# Patient Record
Sex: Female | Born: 1950 | Race: White | Hispanic: No | Marital: Married | Smoking: Never smoker
Health system: Southern US, Community
[De-identification: ages and names within clinical notes are randomized; demographics above are authoritative.]

## PROBLEM LIST (undated history)

## (undated) DIAGNOSIS — E119 Type 2 diabetes mellitus without complications: Secondary | ICD-10-CM

## (undated) DIAGNOSIS — E785 Hyperlipidemia, unspecified: Secondary | ICD-10-CM

## (undated) DIAGNOSIS — I10 Essential (primary) hypertension: Secondary | ICD-10-CM

## (undated) DIAGNOSIS — G5600 Carpal tunnel syndrome, unspecified upper limb: Secondary | ICD-10-CM

## (undated) DIAGNOSIS — M1990 Unspecified osteoarthritis, unspecified site: Secondary | ICD-10-CM

## (undated) DIAGNOSIS — R7989 Other specified abnormal findings of blood chemistry: Secondary | ICD-10-CM

## (undated) DIAGNOSIS — K219 Gastro-esophageal reflux disease without esophagitis: Secondary | ICD-10-CM

## (undated) DIAGNOSIS — C801 Malignant (primary) neoplasm, unspecified: Secondary | ICD-10-CM

## (undated) DIAGNOSIS — R945 Abnormal results of liver function studies: Secondary | ICD-10-CM

## (undated) HISTORY — PX: CATARACT EXTRACTION: SUR2

## (undated) HISTORY — PX: KNEE ARTHROSCOPY: SUR90

## (undated) HISTORY — DX: Hyperlipidemia, unspecified: E78.5

## (undated) HISTORY — DX: Essential (primary) hypertension: I10

## (undated) HISTORY — DX: Unspecified osteoarthritis, unspecified site: M19.90

## (undated) HISTORY — DX: Other specified abnormal findings of blood chemistry: R79.89

## (undated) HISTORY — DX: Carpal tunnel syndrome, unspecified upper limb: G56.00

## (undated) HISTORY — DX: Type 2 diabetes mellitus without complications: E11.9

## (undated) HISTORY — DX: Gastro-esophageal reflux disease without esophagitis: K21.9

## (undated) HISTORY — DX: Abnormal results of liver function studies: R94.5

## (undated) HISTORY — DX: Malignant (primary) neoplasm, unspecified: C80.1

---

## 2000-09-26 ENCOUNTER — Encounter: Payer: Self-pay | Admitting: Emergency Medicine

## 2000-09-26 ENCOUNTER — Inpatient Hospital Stay (HOSPITAL_COMMUNITY): Admission: EM | Admit: 2000-09-26 | Discharge: 2000-09-27 | Payer: Self-pay | Admitting: Emergency Medicine

## 2000-09-27 ENCOUNTER — Encounter: Payer: Self-pay | Admitting: *Deleted

## 2005-12-08 ENCOUNTER — Encounter: Admission: RE | Admit: 2005-12-08 | Discharge: 2005-12-08 | Payer: Self-pay | Admitting: Orthopaedic Surgery

## 2007-09-13 ENCOUNTER — Encounter: Admission: RE | Admit: 2007-09-13 | Discharge: 2007-09-13 | Payer: Self-pay | Admitting: Orthopaedic Surgery

## 2008-09-14 ENCOUNTER — Encounter: Payer: Self-pay | Admitting: Family Medicine

## 2008-09-14 LAB — CONVERTED CEMR LAB: Pap Smear: NORMAL

## 2008-10-06 ENCOUNTER — Emergency Department (HOSPITAL_COMMUNITY): Admission: EM | Admit: 2008-10-06 | Discharge: 2008-10-06 | Payer: Self-pay | Admitting: Emergency Medicine

## 2008-12-25 ENCOUNTER — Encounter: Payer: Self-pay | Admitting: Family Medicine

## 2009-03-09 ENCOUNTER — Encounter: Admission: RE | Admit: 2009-03-09 | Discharge: 2009-03-09 | Payer: Self-pay | Admitting: Family Medicine

## 2009-08-13 ENCOUNTER — Encounter: Payer: Self-pay | Admitting: Family Medicine

## 2009-08-13 LAB — CONVERTED CEMR LAB
Albumin: 4 g/dL
Calcium: 9.7 mg/dL
Cholesterol: 200 mg/dL
Direct LDL: 140 mg/dL
Glucose, Bld: 95 mg/dL
HDL: 47 mg/dL
Sodium: 141 meq/L
Total Bilirubin: 0.7 mg/dL
Total CHOL/HDL Ratio: 4.26

## 2009-11-13 HISTORY — PX: BREAST LUMPECTOMY: SHX2

## 2009-12-02 ENCOUNTER — Encounter: Payer: Self-pay | Admitting: Family Medicine

## 2009-12-09 ENCOUNTER — Encounter: Admission: RE | Admit: 2009-12-09 | Discharge: 2009-12-09 | Payer: Self-pay | Admitting: General Surgery

## 2009-12-13 ENCOUNTER — Encounter: Payer: Self-pay | Admitting: Family Medicine

## 2009-12-14 ENCOUNTER — Encounter: Admission: RE | Admit: 2009-12-14 | Discharge: 2009-12-14 | Payer: Self-pay | Admitting: General Surgery

## 2009-12-15 ENCOUNTER — Ambulatory Visit (HOSPITAL_BASED_OUTPATIENT_CLINIC_OR_DEPARTMENT_OTHER): Admission: RE | Admit: 2009-12-15 | Discharge: 2009-12-15 | Payer: Self-pay | Admitting: General Surgery

## 2009-12-22 ENCOUNTER — Ambulatory Visit: Payer: Self-pay | Admitting: Oncology

## 2010-01-05 ENCOUNTER — Ambulatory Visit: Admission: RE | Admit: 2010-01-05 | Discharge: 2010-03-09 | Payer: Self-pay | Admitting: Radiation Oncology

## 2010-01-05 ENCOUNTER — Encounter: Payer: Self-pay | Admitting: Family Medicine

## 2010-01-05 LAB — CBC WITH DIFFERENTIAL/PLATELET
BASO%: 0.4 % (ref 0.0–2.0)
HCT: 37.3 % (ref 34.8–46.6)
HGB: 12.6 g/dL (ref 11.6–15.9)
LYMPH%: 28.3 % (ref 14.0–49.7)
MCH: 29.9 pg (ref 25.1–34.0)
MCHC: 33.8 g/dL (ref 31.5–36.0)
MCV: 88.3 fL (ref 79.5–101.0)
MONO%: 6 % (ref 0.0–14.0)
NEUT%: 62.5 % (ref 38.4–76.8)
Platelets: 356 10*3/uL (ref 145–400)
RBC: 4.22 10*6/uL (ref 3.70–5.45)

## 2010-01-05 LAB — COMPREHENSIVE METABOLIC PANEL
ALT: 17 U/L (ref 0–35)
AST: 19 U/L (ref 0–37)
Albumin: 4.2 g/dL (ref 3.5–5.2)
Alkaline Phosphatase: 47 U/L (ref 39–117)
Calcium: 9.6 mg/dL (ref 8.4–10.5)
Sodium: 139 mEq/L (ref 135–145)
Total Bilirubin: 0.6 mg/dL (ref 0.3–1.2)

## 2010-01-27 ENCOUNTER — Encounter: Payer: Self-pay | Admitting: Family Medicine

## 2010-01-27 LAB — CONVERTED CEMR LAB
Cholesterol: 222 mg/dL
Triglycerides: 126 mg/dL

## 2010-03-09 ENCOUNTER — Encounter: Payer: Self-pay | Admitting: Family Medicine

## 2010-04-06 ENCOUNTER — Encounter: Payer: Self-pay | Admitting: Family Medicine

## 2010-04-12 ENCOUNTER — Encounter: Payer: Self-pay | Admitting: Family Medicine

## 2010-08-04 ENCOUNTER — Encounter: Payer: Self-pay | Admitting: Family Medicine

## 2010-08-19 ENCOUNTER — Ambulatory Visit: Payer: Self-pay | Admitting: Family Medicine

## 2010-08-19 DIAGNOSIS — I10 Essential (primary) hypertension: Secondary | ICD-10-CM | POA: Insufficient documentation

## 2010-08-23 LAB — CONVERTED CEMR LAB
Albumin: 4.1 g/dL (ref 3.5–5.2)
Alkaline Phosphatase: 56 units/L (ref 39–117)
BUN: 16 mg/dL (ref 6–23)
Calcium: 9.7 mg/dL (ref 8.4–10.5)
Cholesterol: 238 mg/dL — ABNORMAL HIGH (ref 0–200)
Sodium: 141 meq/L (ref 135–145)
Total Bilirubin: 1 mg/dL (ref 0.3–1.2)
Total CHOL/HDL Ratio: 5
Triglycerides: 118 mg/dL (ref 0.0–149.0)

## 2010-08-31 ENCOUNTER — Encounter: Payer: Self-pay | Admitting: Family Medicine

## 2010-08-31 DIAGNOSIS — E7800 Pure hypercholesterolemia, unspecified: Secondary | ICD-10-CM | POA: Insufficient documentation

## 2010-08-31 DIAGNOSIS — G5600 Carpal tunnel syndrome, unspecified upper limb: Secondary | ICD-10-CM | POA: Insufficient documentation

## 2010-11-30 ENCOUNTER — Encounter: Payer: Self-pay | Admitting: Family Medicine

## 2010-12-05 ENCOUNTER — Encounter (INDEPENDENT_AMBULATORY_CARE_PROVIDER_SITE_OTHER): Payer: Self-pay | Admitting: *Deleted

## 2010-12-14 ENCOUNTER — Encounter: Payer: Self-pay | Admitting: Family Medicine

## 2010-12-15 NOTE — Letter (Signed)
Summary: [facility]  [facility]   Imported By: Lanelle Bal 09/06/2010 08:57:58  _____________________________________________________________________  External Attachment:    Type:   Image     Comment:   External Document

## 2010-12-15 NOTE — Letter (Signed)
Summary: [facility]   Imported By: Lanelle Bal 08/17/2010 11:17:50  _____________________________________________________________________  External Attachment:    Type:   Image     Comment:   External Document  Appended Document: [facility]    Clinical Lists Changes  Observations: Added new observation of PAST MED HX: 2011 R breast CA, s/p lumpectomy and sentinel node biopsy and radiation- per Dr. Dwain Sarna (08/17/2010 23:03)       Past History:  Past Medical History: 2011 R breast CA, s/p lumpectomy and sentinel node biopsy and radiation- per Dr. Dwain Sarna

## 2010-12-15 NOTE — Letter (Signed)
Summary: [facility]  [facility]   Imported By: Lanelle Bal 09/06/2010 08:55:13  _____________________________________________________________________  External Attachment:    Type:   Image     Comment:   External Document

## 2010-12-15 NOTE — Op Note (Signed)
Summary: US Guided Biopsy & Breast US/[facility]  US Guided Biopsy & Breast US/[facility]   Imported By: Lanelle Bal 09/06/2010 08:59:10  _____________________________________________________________________  External Attachment:    Type:   Image     Comment:   External Document

## 2010-12-15 NOTE — Assessment & Plan Note (Signed)
Summary: XFER FROM [place] AND CPX/DLO   Vital Signs:  Patient profile:   60 year old female Height:      61 inches Weight:      195.25 pounds BMI:     37.03 Temp:     98.3 degrees F oral Pulse rate:   84 / minute Pulse rhythm:   regular BP sitting:   132 / 80  (left arm) Cuff size:   large  Vitals Entered By: Delilah Shan CMA Duncan Dull) 09-15-10 10:33 AM) CC: Transfer from [place] / CPX, Back Pain   History of Present Illness: Hypertension:      Using medication without problems or lightheadedness: yes Chest pain with exertion:no Edema:no Short of breath:no Average home BPs: normally lower than today Other issues: no  due for labs.   Allergies (verified): No Known Drug Allergies  Past History:  Family History: Last updated: Sep 15, 2010 F dead, CA-lung M alive, born 1930-04-21, crohn's disease  Social History: Last updated: 09/15/10 Married 1973 no tob no alcohol  [facility] the [facility] 3x[facility].   working full time reads for pleasure 2 children, both went to [employer].   Past Medical History: 2010-04-20 R breast CA, s/p lumpectomy and sentinel node biopsy and radiation- per Dr. Dwain Sarna and Dr. Darnelle Catalan GERD; lack of relief with prilosec and other OTC PPIs HTN prev cards work up was negative  Gyn care per Dr. Elana Alm  colonoscopy 2004/04/20, due for repeat in Apr 21, 2011 with Medoff h/o mild increase in LFTs due to fatty liver, seen on u/s, normal LFTs after weight loss  Past Surgical History: R Lumpectomy 04-20-2010 knee scope bilaterally  Family History: F dead, CA-lung M alive, born 41, crohn's disease  Social History: Married 1973 no tob no alcohol  [place] to the Y 3x/week.   working full time reads for pleasure 2 children, both went to [employer].   Review of Systems       See HPI.  Otherwise negative.    Physical Exam  General:  GEN: nad, alert and oriented HEENT: mucous membranes moist NECK: supple w/o LA CV: rrr.  no murmur PULM: ctab, no inc wob ABD: soft, +bs EXT:  no edema SKIN: no acute rash    Impression & Recommendations:  Problem # 1:  HYPERTENSION, BENIGN (ICD-401.1) Requesting records.  Flu shot already done out of clinic.  No change in meds.  Continue exercise.   Her updated medication list for this problem includes:    Benicar Hct 40-25 Mg Tabs (Olmesartan medoxomil-hctz) .Marland Kitchen... Take 1/2 tablet by mouth once daily  Orders: TLB-BMP (Basic Metabolic Panel-BMET) (80048-METABOL) TLB-Hepatic/Liver Function Pnl (80076-HEPATIC) TLB-Lipid Panel (80061-LIPID)  Complete Medication List: 1)  Nexium 40 Mg Cpdr (Esomeprazole magnesium) .... Take 1 tablet by mouth once a day 2)  Benicar Hct 40-25 Mg Tabs (Olmesartan medoxomil-hctz) .... Take 1/2 tablet by mouth once daily 3)  Claritin 10 Mg Tabs (Loratadine) .... Take 1 tablet by mouth once a day 4)  Aspirin 81 Mg Tabs (Aspirin) .... Take 1 tablet by mouth once a day 5)  Multivitamins Tabs (Multiple vitamin) .... Take 1 tablet by mouth once a day   Patient Instructions: 1)  We'll contact you with your lab report.  Take care.  I was glad to see you today. We'll get your records.    Current Allergies (reviewed today): No known allergies    Immunization History:  Influenza Immunization History:    Influenza:  historical (08/05/2010)

## 2010-12-15 NOTE — Letter (Signed)
Summary: [facility]  [facility]   Imported By: Lanelle Bal 09/06/2010 08:54:07  _____________________________________________________________________  External Attachment:    Type:   Image     Comment:   External Document

## 2010-12-15 NOTE — Letter (Signed)
Summary: Results Follow up Letter  [place] [facility]  [address] [place], [place] [address]   Phone: [phone]  Fax: [phone]    12/05/2010 MRN: [id number]    [facility] [address] [place], [place]  [address]    Dear Ms. Sibal,  The following are the results of your recent test(s):  Test         Result    Pap Smear:        Normal _____  Not Normal _____ Comments: ______________________________________________________ Cholesterol: LDL(Bad cholesterol):         Your goal is less than:         HDL (Good cholesterol):       Your goal is more than: Comments:  ______________________________________________________ Mammogram:        Normal __X___  Not Normal _____ Comments:  ___________________________________________________________________ Hemoccult:        Normal _____  Not normal _______ Comments:    _____________________________________________________________________ Other Tests:    We routinely do not discuss normal results over the telephone.  If you desire a copy of the results, or you have any questions about this information we can discuss them at your next office visit.   Sincerely,    Dwana Curd. Para March, M.D.  [facility]

## 2010-12-15 NOTE — Miscellaneous (Signed)
Clinical Lists Changes  Problems: Added new problem of HYPERCHOLESTEROLEMIA (ICD-272.0) Added new problem of CARPAL TUNNEL SYNDROME (ICD-354.0) Observations: Added new observation of COLONNXTDUE: 11/2013 (08/31/2010 9:57) Added new observation of FLUVAXDUE: 08/13/2010 (08/31/2010 9:57) Added new observation of HDLNXTDUE: 08/20/2015 (08/31/2010 9:57) Added new observation of CREATNXTDUE: 08/20/2011 (08/31/2010 9:57) Added new observation of POTASSIUMDUE: 08/20/2011 (08/31/2010 9:57) Added new observation of SOCIAL HX: Married 1973 no tob no alcohol  [facility] the [employer] 3x/[facility].   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  Drug use-no  (08/31/2010 9:57) Added new observation of DRUG USE: no (08/31/2010 9:57) Added new observation of PAST MED HX: 2011 R breast CA, s/p lumpectomy and sentinel node biopsy and radiation- per Dr. Dwain Sarna and Dr. Darnelle Catalan GERD; lack of relief with prilosec and other OTC PPIs prev cards work up was negative  Gyn care per Dr. Elana Alm  colonoscopy 2005, due for repeat in 2012 with Medoff h/o mild increase in LFTs due to fatty liver, seen on u/s, normal LFTs after weight loss CARPAL TUNNEL SYNDROME (ICD-354.0) HYPERCHOLESTEROLEMIA (ICD-272.0) HYPERTENSION, BENIGN (ICD-401.1) 08/23/2010  Hep B Surface Ab  16.58 08/23/2010  Hep B Core Ab, IgM Negative (08/31/2010 9:57) Added new observation of LDL DIR: 158 mg/dL (95/28/4132 4:40) Added new observation of CHOL/HDL: 4.44  (01/27/2010 9:57) Added new observation of HDL: 50 mg/dL (09/09/2535 6:44) Added new observation of TRIGLYC TOT: 126 mg/dL (03/47/4259 5:63) Added new observation of CHOLESTEROL: 222 mg/dL (87/56/4332 9:51) Added new observation of FLU VAX: given  (08/13/2009 10:02) Added new observation of CALCIUM: 9.7 mg/dL (88/41/6606 3:01) Added new observation of ALBUMIN: 4.0 g/dL (60/08/9322 5:57) Added new observation of PROTEIN, TOT: 7.0 g/dL (32/20/2542 7:06) Added new  observation of SGPT (ALT): 21 units/L (08/13/2009 9:57) Added new observation of SGOT (AST): 25 units/L (08/13/2009 9:57) Added new observation of ALK PHOS: 47 units/L (08/13/2009 9:57) Added new observation of BILI TOTAL: 0.7 mg/dL (23/76/2831 5:17) Added new observation of CREATININE: 0.90 mg/dL (61/60/7371 0:62) Added new observation of BUN: 12 mg/dL (69/48/5462 7:03) Added new observation of BG RANDOM: 95 mg/dL (50/07/3817 2:99) Added new observation of ANION GAP: 15.7  (08/13/2009 9:57) Added new observation of K SERUM: 4.7 meq/L (08/13/2009 9:57) Added new observation of NA: 141 meq/L (08/13/2009 9:57) Added new observation of LDL DIR: 140 mg/dL (37/16/9678 9:38) Added new observation of CHOL/HDL: 4.26  (08/13/2009 9:57) Added new observation of HDL: 47 mg/dL (08/29/5101 5:85) Added new observation of TRIGLYC TOT: 96 mg/dL (27/78/2423 5:36) Added new observation of CHOLESTEROL: 200 mg/dL (14/43/1540 0:86) Added new observation of VIT D 1,25OH: 41.2  (12/25/2008 9:57) Added new observation of MAMMOGRAM: normal  (09/14/2008 10:05) Added new observation of PAP SMEAR: normal  (09/14/2008 10:05) Added new observation of BONE DENSITY: normal std dev (01/13/2008 10:05) Added new observation of TD BOOSTER: Td  (11/14/2003 10:05) Added new observation of COLONOSCOPY: normal  (11/14/2003 10:05)      Past History:  Past Medical History: 2011 R breast CA, s/p lumpectomy and sentinel node biopsy and radiation- per Dr. Dwain Sarna and Dr. Darnelle Catalan GERD; lack of relief with prilosec and other OTC PPIs prev cards work up was negative  Gyn care per Dr. Elana Alm  colonoscopy 2005, due for repeat in 2012 with Medoff h/o mild increase in LFTs due to fatty liver, seen on u/s, normal LFTs after weight loss CARPAL TUNNEL SYNDROME (ICD-354.0) HYPERCHOLESTEROLEMIA (ICD-272.0) HYPERTENSION, BENIGN (ICD-401.1) 08/23/2010  Hep B Surface Ab  16.58 08/23/2010  Hep B Core Ab, IgM  Negative   Social  History: Married 1973 no tob no alcohol  [facility] to the Y 3x/week.   working full time reads for pleasure 2 children, both went to [employer].  Drug use-no Drug Use:  no    Preventive Care Screening  Colonoscopy:    Date:  11/14/2003    Next Due:  11/2013    Results:  normal   Mammogram:    Date:  09/14/2008    Results:  normal   Pap Smear:    Date:  09/14/2008    Results:  normal   Bone Density:    Date:  01/13/2008    Results:  normal std dev  Last Tetanus Booster:    Date:  11/14/2003    Results:  Td    Last Flu Vaccine:  Historical (08/05/2010 8:33:15 PM) Flu Vaccine Result Date:  08/13/2009 Flu Vaccine Result:  given

## 2010-12-15 NOTE — Letter (Signed)
Summary: [facility]   Imported By: Lanelle Bal 09/06/2010 08:56:57  _____________________________________________________________________  External Attachment:    Type:   Image     Comment:   External Document

## 2011-01-05 ENCOUNTER — Encounter: Payer: Self-pay | Admitting: Family Medicine

## 2011-01-05 ENCOUNTER — Encounter: Payer: PRIVATE HEALTH INSURANCE | Admitting: Oncology

## 2011-01-10 NOTE — Letter (Signed)
Summary: [facility]   Imported By: Lanelle Bal 01/02/2011 15:56:51  _____________________________________________________________________  External Attachment:    Type:   Image     Comment:   External Document  Appended Document: [facility]    Clinical Lists Changes  Observations: Added new observation of DM PROGRESS: N/A (01/02/2011 22:07) Added new observation of DM FSREVIEW: N/A (01/02/2011 22:07)

## 2011-01-18 ENCOUNTER — Encounter: Payer: Self-pay | Admitting: Family Medicine

## 2011-01-24 NOTE — Letter (Signed)
Summary: [facility]   [facility]   Imported By: Kassie Mends 01/16/2011 09:08:01  _____________________________________________________________________  External Attachment:    Type:   Image     Comment:   External Document  Appended Document: [facility]     Clinical Lists Changes  Observations: Added new observation of PAST MED HX: 2011 breast CA, s/p lumpectomy and sentinel node biopsy and radiation- per Dr. Dwain Sarna and Dr. Darnelle Catalan- released by Dr. Darnelle Catalan as of 2012 GERD; lack of relief with prilosec and other OTC PPIs prev cards work up was negative  Gyn care per Dr. Elana Alm  colonoscopy 2005, due for repeat in 2012 with Medoff h/o mild increase in LFTs due to fatty liver, seen on u/s, normal LFTs after weight loss CARPAL TUNNEL SYNDROME (ICD-354.0) HYPERCHOLESTEROLEMIA (ICD-272.0) HYPERTENSION, BENIGN (ICD-401.1) 08/23/2010  Hep B Surface Ab  16.58 08/23/2010  Hep B Core Ab, IgM Negative (01/17/2011 0:55)       Past History:  Past Medical History: 2011 breast CA, s/p lumpectomy and sentinel node biopsy and radiation- per Dr. Dwain Sarna and Dr. Darnelle Catalan- released by Dr. Darnelle Catalan as of 2012 GERD; lack of relief with prilosec and other OTC PPIs prev cards work up was negative  Gyn care per Dr. Elana Alm  colonoscopy 2005, due for repeat in 2012 with Medoff h/o mild increase in LFTs due to fatty liver, seen on u/s, normal LFTs after weight loss CARPAL TUNNEL SYNDROME (ICD-354.0) HYPERCHOLESTEROLEMIA (ICD-272.0) HYPERTENSION, BENIGN (ICD-401.1) 08/23/2010  Hep B Surface Ab  16.58 08/23/2010  Hep B Core Ab, IgM Negative

## 2011-02-01 LAB — COMPREHENSIVE METABOLIC PANEL
Albumin: 4.1 g/dL (ref 3.5–5.2)
CO2: 31 mEq/L (ref 19–32)
Creatinine, Ser: 0.79 mg/dL (ref 0.4–1.2)
GFR calc non Af Amer: >60 mL/min (ref >60)
Glucose, Bld: 94 mg/dL (ref 70–99)
Potassium: 3.6 mEq/L (ref 3.5–5.1)
Total Bilirubin: 0.6 mg/dL (ref 0.3–1.2)

## 2011-02-01 LAB — CBC
HCT: 40.4 % (ref 36.0–46.0)
Hemoglobin: 13.5 g/dL (ref 12.0–15.0)
RBC: 4.5 MIL/uL (ref 3.87–5.11)
RDW: 13.3 % (ref 11.5–15.5)

## 2011-02-01 LAB — CANCER ANTIGEN 27.29: CA 27.29: 22 U/mL (ref 0–39)

## 2011-02-01 LAB — CEA: CEA: 0.6 ng/mL (ref 0.0–5.0)

## 2011-02-01 LAB — LACTATE DEHYDROGENASE: LDH: 165 U/L (ref 94–250)

## 2011-02-13 ENCOUNTER — Other Ambulatory Visit: Payer: Self-pay | Admitting: *Deleted

## 2011-02-13 MED ORDER — OLMESARTAN MEDOXOMIL-HCTZ 40-25 MG PO TABS: 0.5000 | ORAL_TABLET | Freq: Every day | ORAL | Status: DC

## 2011-02-13 MED ORDER — ESOMEPRAZOLE MAGNESIUM 40 MG PO CPDR: 40.0000 mg | DELAYED_RELEASE_CAPSULE | Freq: Every day | ORAL | Status: DC

## 2011-02-13 NOTE — Telephone Encounter (Signed)
Please mail to patient

## 2011-02-13 NOTE — Telephone Encounter (Signed)
Rx Mailed.

## 2011-03-01 ENCOUNTER — Other Ambulatory Visit: Payer: Self-pay | Admitting: *Deleted

## 2011-03-01 DIAGNOSIS — E7800 Pure hypercholesterolemia, unspecified: Secondary | ICD-10-CM

## 2011-03-10 ENCOUNTER — Other Ambulatory Visit: Payer: Self-pay

## 2011-03-17 ENCOUNTER — Ambulatory Visit: Payer: Self-pay | Admitting: Family Medicine

## 2011-03-31 NOTE — H&P (Signed)
[place]. [facility]  Patient:    Anita Cooper, Anita Cooper                     MRN: [id number] Adm. Date:  [id number] Attending:  Meade Maw A Dictator:   Anselm Lis, N.P. CC:         Dellis Anes. Idell Pickles, M.D.   History and Physical  DATE OF BIRTH:  11-03-1951  HISTORY OF PRESENT ILLNESS:  Ms. Jarold Motto is a pleasant, calm, obese 60 year old female with a history of hypertension under treatment for the last three years. She has a history of dyslipidemia. The patient noted at about 4:45 this morning onset of lower substernal and right of lower sternal stabbing/burning discomfort rated 5/10 on pain scale. This discomfort lasted about 15 to 20 minutes and resolved by the time EMS arrived. She is currently pain free. EKG revealed an SR with prolonged QTC. No acute ischemic changes. The patient denies a history of exertional chest pain, negative history of DOE, pedal edema nor orthopnea. She does have a history of palpitations, but none over the last year. She has had a past stress myocardial perfusion scan and echocardiogram, about 15 years ago, as part of a workup for her palpitations, but the results were "okay" per patient.  CARDIAC RISK FACTORS:  Obesity, hypertension, dyslipidemia.  PAST MEDICAL HISTORY: 1. Hypertension for about three years. 2. Dyslipidemia, just started Zocor three weeks earlier. 3. Right orthoscopic knee surgery. 4. Arthritis right hand. 5. History of palpitations, none over the last year. 6. History of anxiety.  The patient denies a history of diabetes mellitus, cancer nor asthma.  ALLERGIES:  No known drug allergies.  MEDICATIONS:  Zocor 40 mg p.o. q.d., Norvasc 5 mg p.o. q.d., Diovan hydrochlorothiazide 160/12.5 q.d., Ortho-Cyclen q.d.  SOCIAL HISTORY AND HABITS:  She is married with two children ages 91 and 39 alive and well. Tobacco negative. ETOH negative. Caffeine not excessive.  FAMILY HISTORY:  Her parents are alive  and well, although her father has hypertension. She has two sisters and one brother alive and well.  REVIEW OF SYSTEMS:  As in HPI, social history and past medical history. Otherwise denies problems with light headedness, dizziness, syncope, nor asterixis. No problems with hearing or vision. Negative dysphagia to food or fluids. No problems with constipation, diarrhea, melena nor bright red blood per rectum. Negative dysuria nor hematuria. Has noted a weight gain of approximately 100 pounds seeming to coincide after she took some prednisone for a rash. Denies problems with orthopnea, PND nor pedal edema.  PHYSICAL EXAMINATION:  VITAL SIGNS:  Blood pressure 133/77, heart rate 91 and regular. O2 saturation 99% on 2 L nasal cannula. Temperature 98.1.  GENERAL:  She is an obese middle-aged female in no apparent distress. She is accompanied by her mother, husband and sister.  HEENT:  Brisk and bilateral carotid upstroke without bruit. Negative significant JVD.  CHEST:  Clear without CPA tenderness.  CARDIAC:  Regular rate and rhythm, tachy, systolic murmur 2/3 radiating to her back. Murmur is best appreciated at the left upper sternal border. Does have a bit of a thrill left of the supraclavicular area.  ABDOMEN:  Obese, soft, nondistended. Normoactive bowel sounds. Negative abdominal aorta, no femoral bruit. Abdomen nontender to applied pressure; no masses nor organomegaly appreciated. Difficult examination secondary to obesity.  NEUROLOGICAL:  Cranial nerves 2-12 grossly intact; alert and oriented x 3.  GENITAL AND RECTAL:  Deferred.  LABORATORY DATA:  EKG revealed prolonged QTC at 604 msec, otherwise negative for ischemic changes.  Sodium 143, potassium 3.2, chloride 101, CO2 30, BUN 11, creatinine 1.1, glucose 106. Hemoglobin 14, hematocrit 41.  Chest x-ray revealed no active disease.  IMPRESSION: 1. Atypical chest discomfort in this 60 year old obese female. History of     dyslipidemia and hypertension. She is currently pain free. Her EKG is    nonischemic. Her cardiac enzymes are pending. 2. QTC prolongation probably secondary to hypokalemia. 3. Hypokalemia with serum potassium 3.2. 4. Systolic murmur with radiation to the back, loudest at left upper sternal    border. 5. Tachycardia.  PLAN: 1. Admit to telemetry, rule out MI protocol with serial cardiac enzymes, daily    EKG. 2. Supplement potassium 40 mEq now. Will recheck potassium and if less than    3.5 will give an additional 40 mEq. 3. Check TSH. 4. Echocardiogram, assess for valvular abnormalities in setting of murmur. 5. Stress Cardiolite. Rest images today with stress in the morning to assess    for evidence of myocardial ischemia as etiology of chest discomfort. 6. Initial Toprol 25 mg p.o. q.d. for tachyarrhythmia. DD:  09/26/00 TD:  09/26/00 Job: [id number]

## 2011-03-31 NOTE — Discharge Summary (Signed)
[place]. [facility]  Patient:    Anita Cooper, Anita Cooper                     MRN: [id number] Adm. Date:  [id number] Disc. Date: 09/27/00 Attending:  Meade Maw A Dictator:   Anselm Lis, N.P. CC:         Dr. Foye Deer, [facility]   Discharge Summary  PROCEDURES: 1. Stress Cardiolite which was negative for ischemia and scar.     Normal left    ventricular function. 2. 2D echocardiogram revealing trivial tricuspid regurgitation with normal    tricuspid valves.  Other valves normal.  Preserved left ventricular    function.  HOSPITAL COURSE:  #1 - CHEST DISCOMFORT:  Anita Cooper is a pleasant, obese, 59 year old female with history of hypertension and dyslipidemia who early on the morning of admission developed lower substernal and right lower substernal stabbing/burning discomfort resolving after 15-20 minutes.  She was transported via EMS to [facility] where she was admitted for 22-hour observation.  She ruled out for myocardial infarction by negative serial cardiac enzymes.  She underwent a stress Cardiolite scan which was negative for ischemia and scar.  She was discharged home in stable and improved condition remaining pain free during the course of admission.  #2 - PROLONGED QTC, PROBABLY RELATED TO HYPOKALEMIA:  EKG QTC found prolonged to 604 msec setting a serum potassium of 3.2.  QTC prolongation resolved after potassium supplementation and was subsequently at approximately 400 msec on followup EKGs.  She was discharged home on potassium supplementations with 20 mEq q.d.  She will have follow up BMP at Dr. Toya Smothers office.  #3 - SINUS TACHYCARDIA:  The patients heart rate in the ER was in the 90s. Her TSH was checked and returned okay at 1.017.  Her heart rate overnight during hospital admission was running in the 80s.  She does have history of palpitations, although they are not bothersome to her over the last few  years. She did have an episode of palpitations just prior to admission.  Primary care  may consider change to beta blocker for hypertensive control in the future if palpitations become a recurrent issue.  #4 - HISTORY OF DYSLIPIDEMIA:  The patient was recently placed on Zocor and managed by primary care.  #5 - TRIVIAL TRICUSPID REGURGITATION:  Auscultation of a soft, left upper sternal border with radiation to lateral and back side.  A 2D echocardiogram revealed trivial tricuspid regurgitation (physiologic) with other valves normal.  LV function was preserved.  There were no SBE prophylaxis necessary. The tricuspid regurgitation is likely source of appreciated murmur.  CONDITION ON DISCHARGE:  The patient is discharged home in stable condition.  DISCHARGE MEDICATIONS: 1. Zocor 40 mg one p.o. q.d. 2. Norvasc 5 mg p.o. q.d. 3. Diovan/HCT 160/12.5 one p.o. q.d. 4. (New) K-Dur 20 mEq once daily.  DIET:  Low fat, low cholesterol diet recommended.  FOLLOWUP:  Follow up with Dr. Idell Pickles at next available.  The patient will need potassium checked at the office visit.  LABORATORY DATA AND X-RAY FINDINGS:  EKG revealed normal sinus rhythm, but with prolonged QTC at 604 msec, negative ischemic changes.  Followup EKG revealed resolution of QTC prolongation down to 400 msec.  Sodium 143, potassium 3.2 (3.7 after supplementation), chloride 102, CO2 25, BUN 11, creatinine 1.1.  Hemoglobin 13.1, WBC 10.1, hematocrit 37, platelets 434. Chest x-ray revealed no active disease. DD:  09/27/00 TD:  09/27/00 Job: [id number]

## 2011-04-18 ENCOUNTER — Encounter (INDEPENDENT_AMBULATORY_CARE_PROVIDER_SITE_OTHER): Payer: Self-pay | Admitting: General Surgery

## 2011-05-18 ENCOUNTER — Encounter: Payer: Self-pay | Admitting: Family Medicine

## 2011-05-18 DIAGNOSIS — Z9889 Other specified postprocedural states: Secondary | ICD-10-CM | POA: Insufficient documentation

## 2011-05-18 DIAGNOSIS — K219 Gastro-esophageal reflux disease without esophagitis: Secondary | ICD-10-CM | POA: Insufficient documentation

## 2011-06-09 ENCOUNTER — Other Ambulatory Visit (INDEPENDENT_AMBULATORY_CARE_PROVIDER_SITE_OTHER): Payer: PRIVATE HEALTH INSURANCE | Admitting: Family Medicine

## 2011-06-09 DIAGNOSIS — E7800 Pure hypercholesterolemia, unspecified: Secondary | ICD-10-CM

## 2011-06-09 LAB — LIPID PANEL
Cholesterol: 216 mg/dL — ABNORMAL HIGH (ref 0–200)
HDL: 42.9 mg/dL (ref >39.00)
Triglycerides: 133 mg/dL (ref 0.0–149.0)
VLDL: 26.6 mg/dL (ref 0.0–40.0)

## 2011-06-16 ENCOUNTER — Encounter: Payer: Self-pay | Admitting: Family Medicine

## 2011-06-16 ENCOUNTER — Ambulatory Visit (INDEPENDENT_AMBULATORY_CARE_PROVIDER_SITE_OTHER): Payer: PRIVATE HEALTH INSURANCE | Admitting: Family Medicine

## 2011-06-16 DIAGNOSIS — E7800 Pure hypercholesterolemia, unspecified: Secondary | ICD-10-CM

## 2011-06-16 DIAGNOSIS — I10 Essential (primary) hypertension: Secondary | ICD-10-CM

## 2011-06-16 MED ORDER — LISINOPRIL 40 MG PO TABS: 40.0000 mg | ORAL_TABLET | Freq: Every day | ORAL | Status: DC

## 2011-06-16 MED ORDER — HYDROCHLOROTHIAZIDE 12.5 MG PO TABS: 12.5000 mg | ORAL_TABLET | Freq: Every day | ORAL | Status: DC

## 2011-06-16 NOTE — Patient Instructions (Addendum)
Try to cut back on your calories and let me know if your blood pressure isn't controlled.

## 2011-06-16 NOTE — Progress Notes (Signed)
She was in the ER last night for/with mother, concern for DVT.  Mother has u/s pending this AM.  Pt is doing okay o/w.  She's trying to work on diet and exercise.  [facility].  Going 3-4 times a week.  Her weight is still up.  She's charted her calories prev and this helped.   Elevated Cholesterol: no medicine, trying to work on chol level w/o meds.  We talked about labs.    Asking about changing to cheaper BP meds.  See plan.    Meds, vitals, and allergies reviewed.   ROS: See HPI.  Otherwise negative.    GEN: nad, alert and oriented HEENT: mucous membranes moist NECK: supple w/o LA CV: rrr. PULM: ctab, no inc wob ABD: soft, +bs EXT: no edema SKIN: no acute rash

## 2011-06-18 ENCOUNTER — Encounter: Payer: Self-pay | Admitting: Family Medicine

## 2011-06-18 NOTE — Assessment & Plan Note (Signed)
Continue to work on weight and diet/exercise.  Will check periodically.  She agrees.  No new meds for chol.

## 2011-06-18 NOTE — Assessment & Plan Note (Signed)
Change to ACE and separate HCTZ.  Will follow BP. She never has intolerance/allergy to BP meds.

## 2011-08-07 ENCOUNTER — Encounter (INDEPENDENT_AMBULATORY_CARE_PROVIDER_SITE_OTHER): Payer: Self-pay | Admitting: General Surgery

## 2011-08-07 ENCOUNTER — Ambulatory Visit (INDEPENDENT_AMBULATORY_CARE_PROVIDER_SITE_OTHER): Payer: 59 | Admitting: General Surgery

## 2011-08-07 VITALS — BP 162/78 | HR 68 | Temp 98.6°F | Resp 16 | Ht 61.0 in | Wt 212.0 lb

## 2011-08-07 DIAGNOSIS — Z853 Personal history of malignant neoplasm of breast: Secondary | ICD-10-CM

## 2011-08-07 NOTE — Patient Instructions (Signed)
Breast Problems and Self Exam Completing monthly breast exams may pick up problems early and save lives. There can be numerous causes of swelling, tenderness or lumps in the breasts. Some of these causes are:   Fibrocystic breast syndrome (noncancerous lumps). This is the most common cause of lumps in the breast.   Fibroadenoma breast tumors of unknown cause. These are noncancerous (benign) lumps.   Benign fatty tumors (lipomas).   Cancer of the breast.  By doing monthly breast exams, you get to know how your breasts feel and how they can change from month to month. This allows you to notice changes early. It can also offer you some reassurance that your breast health is good.  BREAST SELF EXAM There are a few points to follow when doing a thorough breast exam. The best time to examine your breasts is 5 to 7 days after your menstrual period is over. During menstruation, the breasts are lumpier, and it may be more difficult to pick up changes. If you do not menstruate, have reached menopause, or had a hysterectomy (uterus removal), examine your breasts the first day of every month. After 3 to 4 months, you will become more familiar with the variations of your breasts and more comfortable with the exam.  Perform your breast exam monthly. Keep a written record with breast changes or normal findings for each breast. This makes it easier to be sure of changes, so you do not need to depend only on memory for size, tenderness, or location. Try to do the exam at the same time each month, and write down where you are in your menstrual cycle, if you are still menstruating.   Look at your breasts. Stand in front of a mirror with your hands clasped behind your head. Tighten your chest muscles and look for asymmetry. This means a difference in shape or contour from one breast to the other, such as puckers, dips or bumps. Also, look for skin changes.    Lean forward with your hands on your hips. Again, look for  symmetry and skin changes.   While showering, soap the breasts. Then, carefully feel the breasts with your fingertips, while holding the other arm (on the side of the breast you are examining) over your head. Do this with each breast, carefully feeling for lumps or changes. Typically, a circular motion with moderate fingertip pressure should be used.    Repeat this exam while lying on your back. Put your arm behind your head and a pillow under your shoulders. Again, use your fingertips to examine both breasts, feeling for lumps and thickening. Begin at the top of your breast, and go clockwise around the whole breast.   At the end of your exam, gently squeeze each nipple to see if there is any drainage of fluids. Look for nipple changes, dimpling, or redness.   Lastly, examine the upper chest and collarbone (clavicle) areas, and in your armpits.  It is not necessary to be alarmed if you find a breast lump. Most of them are not cancerous. However, it is necessary to see your caregiver if a lump is found, in order to have it looked at. Document Released: 10/30/2005 Document Re-Released: 11/21/2009 [employer] Patient Information 2011 [employer]. 

## 2011-08-07 NOTE — Progress Notes (Signed)
Subjective:     Patient ID: Regan Rakers, female   DOB: 1951/06/22, 60 y.o.   MRN: [id number]  HPI  This is a 60 year old female who had a stage 0 right breast cancer in early 2011 that was treated with a lumpectomy and sentinel node biopsy. She has completed radiation therapy as well. I then seen her for 6 months in followup. She had a normal mammogram in January of this year and is due for her next in January of 2013. She comes in today with complaints of some tenderness in both breasts but without any other complaints at all. She does have a small area on her left areola that has come up over the last few weeks it is a little bit of a bump but does not otherwise bother her at all.  Review of Systems     Objective:   Physical Exam  Constitutional: She appears well-developed and well-nourished.  Neck: Neck supple.  Pulmonary/Chest: Right breast exhibits inverted nipple (longstanding no change after surgery). Right breast exhibits no mass, no nipple discharge, no skin change and no tenderness. Left breast exhibits no inverted nipple, no mass, no nipple discharge, no skin change and no tenderness. Breasts are symmetrical.    Lymphadenopathy:    She has no cervical adenopathy.       Assessment:     History of stage 0 right breast cancer    Plan:     She has a normal mammogram and a normal exam today. The area on her left areola is a skin lesion I do not think this is related to the breast really at all. I think he should just go away but I told her if it's not gone in a month or so to call me I would look at it again. She is going to continue her own self exams, get her mammogram in January, and followup with me in 6 months for an additional exam. She is on call me sooner for any other new complaints.

## 2011-09-11 ENCOUNTER — Other Ambulatory Visit: Payer: Self-pay | Admitting: Family Medicine

## 2011-09-13 ENCOUNTER — Ambulatory Visit: Payer: Self-pay

## 2011-11-15 ENCOUNTER — Encounter: Payer: Self-pay | Admitting: Family Medicine

## 2011-11-15 ENCOUNTER — Ambulatory Visit (INDEPENDENT_AMBULATORY_CARE_PROVIDER_SITE_OTHER): Payer: Self-pay | Admitting: Family Medicine

## 2011-11-15 VITALS — BP 120/76 | HR 88 | Temp 98.4°F | Ht 61.0 in | Wt 213.2 lb

## 2011-11-15 DIAGNOSIS — J069 Acute upper respiratory infection, unspecified: Secondary | ICD-10-CM

## 2011-11-15 MED ORDER — BENZONATATE 200 MG PO CAPS: 200.0000 mg | ORAL_CAPSULE | Freq: Three times a day (TID) | ORAL | Status: AC | PRN

## 2011-11-15 NOTE — Patient Instructions (Addendum)
Drink lots of fluids and get rest  Update [place] if worse or fever returns Cough may get more productive mucinex DM is fine Also try tessalon for cough additionally - do not chew the pill- swallow whole If not starting to improve in a week - let us know / follow up  Upper Respiratory Infection, Adult An upper respiratory infection (URI) is also sometimes known as the common cold. The upper respiratory tract includes the nose, sinuses, throat, trachea, and bronchi. Bronchi are the airways leading to the lungs. Most people improve within 1 week, but symptoms can last up to 2 weeks. A residual cough may last even longer.   CAUSES Many different viruses can infect the tissues lining the upper respiratory tract. The tissues become irritated and inflamed and often become very moist. Mucus production is also common. A cold is contagious. You can easily spread the virus to others by oral contact. This includes kissing, sharing a glass, coughing, or sneezing. Touching your mouth or nose and then touching a surface, which is then touched by another person, can also spread the virus. SYMPTOMS   Symptoms typically develop 1 to 3 days after you come in contact with a cold virus. Symptoms vary from person to person. They may include:  Runny nose.     Sneezing.    Nasal congestion.     Sinus irritation.     Sore throat.     Loss of voice (laryngitis).     Cough.    Fatigue.    Muscle aches.     Loss of appetite.     Headache.    Low-grade fever.  DIAGNOSIS   You might diagnose your own cold based on familiar symptoms, since most people get a cold 2 to 3 times a year. Your caregiver can confirm this based on your exam. Most importantly, your caregiver can check that your symptoms are not due to another disease such as strep throat, sinusitis, pneumonia, asthma, or epiglottitis. Blood tests, throat tests, and X-rays are not necessary to diagnose a common cold, but they may sometimes be helpful in  excluding other more serious diseases. Your caregiver will decide if any further tests are required. RISKS AND COMPLICATIONS   You may be at risk for a more severe case of the common cold if you smoke cigarettes, have chronic heart disease (such as heart failure) or lung disease (such as asthma), or if you have a weakened immune system. The very young and very old are also at risk for more serious infections. Bacterial sinusitis, middle ear infections, and bacterial pneumonia can complicate the common cold. The common cold can worsen asthma and chronic obstructive pulmonary disease (COPD). Sometimes, these complications can require emergency medical care and may be life-threatening. PREVENTION   The best way to protect against getting a cold is to practice good hygiene. Avoid oral or hand contact with people with cold symptoms. Wash your hands often if contact occurs. There is no clear evidence that vitamin C, vitamin E, echinacea, or exercise reduces the chance of developing a cold. However, it is always recommended to get plenty of rest and practice good nutrition. TREATMENT   Treatment is directed at relieving symptoms. There is no cure. Antibiotics are not effective, because the infection is caused by a virus, not by bacteria. Treatment may include:  Increased fluid intake. Sports drinks offer valuable electrolytes, sugars, and fluids.     Breathing heated mist or steam (vaporizer or shower).  Eating chicken soup or other clear broths, and maintaining good nutrition.     Getting plenty of rest.     Using gargles or lozenges for comfort.     Controlling fevers with ibuprofen or acetaminophen as directed by your caregiver.     Increasing usage of your inhaler if you have asthma.  Zinc gel and zinc lozenges, taken in the first 24 hours of the common cold, can shorten the duration and lessen the severity of symptoms. Pain medicines may help with fever, muscle aches, and throat pain. A variety  of non-prescription medicines are available to treat congestion and runny nose. Your caregiver can make recommendations and may suggest nasal or lung inhalers for other symptoms.   HOME CARE INSTRUCTIONS    Only take over-the-counter or prescription medicines for pain, discomfort, or fever as directed by your caregiver.     Use a warm mist humidifier or inhale steam from a shower to increase air moisture. This may keep secretions moist and make it easier to breathe.     Drink enough water and fluids to keep your urine clear or pale yellow.     Rest as needed.     Return to work when your temperature has returned to normal or as your caregiver advises. You may need to stay home longer to avoid infecting others. You can also use a face mask and careful hand washing to prevent spread of the virus.  SEEK MEDICAL CARE IF:    After the first few days, you feel you are getting worse rather than better.     You need your caregiver's advice about medicines to control symptoms.     You develop chills, worsening shortness of breath, or brown or red sputum. These may be signs of pneumonia.     You develop yellow or brown nasal discharge or pain in the face, especially when you bend forward. These may be signs of sinusitis.     You develop a fever, swollen neck glands, pain with swallowing, or white areas in the back of your throat. These may be signs of strep throat.  SEEK IMMEDIATE MEDICAL CARE IF:    You have a fever.     You develop severe or persistent headache, ear pain, sinus pain, or chest pain.     You develop wheezing, a prolonged cough, cough up blood, or have a change in your usual mucus (if you have chronic lung disease).     You develop sore muscles or a stiff neck.  Document Released: 04/25/2001 Document Revised: 07/12/2011 Document Reviewed: 03/03/2011 [facility] Patient Information 2012 [place], [place].

## 2011-11-15 NOTE — Progress Notes (Signed)
Subjective:    Patient ID: Anita Cooper, female    DOB: 1951/01/31, 61 y.o.   MRN: [id number]  HPI Had fever fri and sat  Low grade           (flu shot was in oct) Chills and body aches a bit  Now congestion in nose and head and now in her upper chest Using netti pot  Has been a bit hoarse  Some pain in ears and throat- better now   Coughing - occ a little green phlegm Last night felt a little tight and wheezy- not since Does not think she needs an inhaler   mucinex DM netti pot flonase   Patient Active Problem List  Diagnoses  . HYPERCHOLESTEROLEMIA  . CARPAL TUNNEL SYNDROME  . HYPERTENSION, BENIGN  . S/P colonoscopy  . GERD (gastroesophageal reflux disease)  . History of breast cancer in female   Past Medical History  Diagnosis Date  . GERD (gastroesophageal reflux disease)     Lack of relief with Prilosec and other OTC PPI's  . Elevated liver function tests     Mild increase due to fatty liver seen on [place], normal after weight loss  . Carpal tunnel syndrome   . Hyperlipidemia   . Hypertension     Benign  . Arthritis   . Cancer 2011 - Released by Dr. Darnelle Catalan 2012    stage 0 right breast S/P lumpectomy and sentinel node biopsy and radiation - per Dr. Dwain Sarna.   Past Surgical History  Procedure Date  . Knee arthroscopy     Bilaterally  . Breast lumpectomy 2011    right lumpectomy, sentinel node biopsy   History  Substance Use Topics  . Smoking status: Never Smoker   . Smokeless tobacco: Not on file  . Alcohol Use: No   Family History  Problem Relation Age of Onset  . Cancer Father     Lung   No Known Allergies Current Outpatient Prescriptions on File Prior to Visit  Medication Sig Dispense Refill  . aspirin 81 MG tablet Take 81 mg by mouth daily.        . fluticasone (FLONASE) 50 MCG/ACT nasal spray Place 2 sprays into the nose daily.        Marland Kitchen loratadine (CLARITIN) 10 MG tablet Take 10 mg by mouth daily.        . Multiple Vitamin (MULTIVITAMIN)  tablet Take 1 tablet by mouth daily.        Marland Kitchen NEXIUM 40 MG capsule TAKE ONE CAPSULE EVERY DAY BEFORE BREAKFAST  30 capsule  6  . hydrochlorothiazide (HYDRODIURIL) 12.5 MG tablet Take 1 tablet (12.5 mg total) by mouth daily.  90 tablet  3  . lisinopril (PRINIVIL,ZESTRIL) 40 MG tablet Take 1 tablet (40 mg total) by mouth daily.  90 tablet  3        Review of Systems Review of Systems  Constitutional: Negative for, appetite change,  and unexpected weight change.  Eyes: Negative for pain and visual disturbance.  ENT pos for cong and rhinorrhea, neg for sinus pain or st  Respiratory: Negative for sob , pos for cough and wheeze  Cardiovascular: Negative for cp or palpitations    Gastrointestinal: Negative for nausea, diarrhea and constipation.  Genitourinary: Negative for urgency and frequency.  Skin: Negative for pallor or rash   Neurological: Negative for weakness, light-headedness, numbness and headaches.  Hematological: Negative for adenopathy. Does not bruise/bleed easily.  Psychiatric/Behavioral: Negative for dysphoric mood. The patient is  not nervous/anxious.          Objective:   Physical Exam  Constitutional: She appears well-developed and well-nourished. No distress.  HENT:  Head: Normocephalic and atraumatic.  Right Ear: External ear normal.  Left Ear: External ear normal.  Mouth/Throat: Oropharynx is clear and moist.       Nares are injected and congested  No sinus tenderness Throat- post nasal drip  Eyes: Conjunctivae and EOM are normal. Pupils are equal, round, and reactive to light. Right eye exhibits no discharge. Left eye exhibits no discharge.  Neck: Normal range of motion. Neck supple.  Cardiovascular: Normal rate, regular rhythm and normal heart sounds.   Pulmonary/Chest: Effort normal and breath sounds normal. No respiratory distress. She has no wheezes. She has no rales. She exhibits no tenderness.  Lymphadenopathy:    She has no cervical adenopathy.    Neurological: She is alert.  Skin: Skin is warm and dry. No rash noted. No erythema. No pallor.  Psychiatric: She has a normal mood and affect.          Assessment & Plan:

## 2011-11-15 NOTE — Assessment & Plan Note (Signed)
With head and chest congestion (fever is resolved) Reassuring exam Disc symptomatic care - see instructions on AVS  Trial of tessalon  inst to update if worse or if not improving in 7 days- but that the cough may take longer to improve

## 2011-12-01 ENCOUNTER — Ambulatory Visit (INDEPENDENT_AMBULATORY_CARE_PROVIDER_SITE_OTHER): Payer: Self-pay | Admitting: Family Medicine

## 2011-12-01 ENCOUNTER — Encounter: Payer: Self-pay | Admitting: Family Medicine

## 2011-12-01 DIAGNOSIS — J069 Acute upper respiratory infection, unspecified: Secondary | ICD-10-CM

## 2011-12-01 MED ORDER — OLMESARTAN MEDOXOMIL-HCTZ 40-25 MG PO TABS: 0.5000 | ORAL_TABLET | Freq: Every day | ORAL | Status: DC

## 2011-12-01 NOTE — Patient Instructions (Signed)
Stop the flonase for now, use the tessalon, and this should gradually get better.  It may take another few weeks for the cough to go away.

## 2011-12-01 NOTE — Progress Notes (Signed)
Still with some dry cough and some pain on R side of chest, no sx on L side.  No pain with cough, but present with certain movements.  No pain with deep breath.  She stopped the mucinex.   No recent fevers.  She had some sinus congestion this AM but she doesn't have facial pain.  She had some R ear pain yesterday, but is better today.  The cough is getting better, but it is improving slowly.  Tessalon helps some with the cough.    Mother in law recently diet in hospital.    Meds, vitals, and allergies reviewed.   ROS: See HPI.  Otherwise, noncontributory.  GEN: nad, alert and oriented HEENT: mucous membranes moist, tm w/o erythema, nasal exam w/o erythema, scant clear discharge noted,  OP with cobblestoning, Sinuses not ttp R nostril with adherent clot.   NECK: supple w/o LA CV: rrr.   PULM: ctab, no inc wob EXT: no edema SKIN: no acute rash R chest wall mildly ttp, no bruising

## 2011-12-03 NOTE — Assessment & Plan Note (Signed)
Clear lungs, nontoxic, likely resolving viral process with chest wall irritation from cough.  F/u prn.

## 2011-12-22 ENCOUNTER — Encounter (INDEPENDENT_AMBULATORY_CARE_PROVIDER_SITE_OTHER): Payer: Self-pay | Admitting: General Surgery

## 2011-12-22 ENCOUNTER — Ambulatory Visit (INDEPENDENT_AMBULATORY_CARE_PROVIDER_SITE_OTHER): Payer: 59 | Admitting: General Surgery

## 2011-12-22 VITALS — BP 150/74 | HR 80 | Resp 16 | Ht 61.0 in | Wt 215.0 lb

## 2011-12-22 DIAGNOSIS — Z853 Personal history of malignant neoplasm of breast: Secondary | ICD-10-CM

## 2011-12-22 NOTE — Progress Notes (Signed)
Subjective:     Patient ID: Anita Cooper, female   DOB: 1951/03/30, 61 y.o.   MRN: [id number]  HPI This is a 61 year old female known to me from a lumpectomy with sentinel lymph node biopsy in early 2011 for a stage 0 right breast cancer. This is hormone receptor negative. Following surgery she then underwent radiation therapy. She has done well since then and returns today without any complaints referable to her breasts. She feels no masses and is not concerned about any of her exam. She has no discharge. She is otherwise been doing well since our last visit. She has however undergone a mammogram on February 6 that shows some diffuse skin thickening in the right breast as well as some postsurgical changes with clips in place. There has been interval increasing calcifications in the right breast in the upper outer quadrant that on magnification views are not clustered. The left breast is normal. She was recommended a 6 month followup on that side he comes in today to discuss back.  Review of Systems     Objective:   Physical Exam  Vitals reviewed. Constitutional: She appears well-developed and well-nourished.  Neck: Neck supple.  Pulmonary/Chest: Right breast exhibits inverted nipple (unchanged). Right breast exhibits no mass, no nipple discharge, no skin change and no tenderness. Left breast exhibits no inverted nipple, no mass, no nipple discharge, no skin change and no tenderness. Breasts are symmetrical.    Lymphadenopathy:    She has no cervical adenopathy.    She has no axillary adenopathy.       Right: No supraclavicular adenopathy present.       Left: No supraclavicular adenopathy present.       Assessment:     History of stage 0 right breast cancer    Plan:     She has no clinical evidence of recurrence today at all. We reviewed her mammogram and discussed. We discussed either a six-month followup or possibly talk to radiology about doing a biopsy based on her concern. I  think in the appearance that it is perfectly reasonable to go with a six-month followup as she talked with Dr. Margy Clarks about. She and I both agreed to do a six-month followup I will plan on seeing her after that.

## 2011-12-22 NOTE — Patient Instructions (Signed)

## 2011-12-26 ENCOUNTER — Encounter (INDEPENDENT_AMBULATORY_CARE_PROVIDER_SITE_OTHER): Payer: Self-pay

## 2012-02-05 ENCOUNTER — Encounter (INDEPENDENT_AMBULATORY_CARE_PROVIDER_SITE_OTHER): Payer: 59 | Admitting: General Surgery

## 2012-02-28 ENCOUNTER — Other Ambulatory Visit: Payer: Self-pay | Admitting: *Deleted

## 2012-02-28 MED ORDER — ESOMEPRAZOLE MAGNESIUM 40 MG PO CPDR: 40.0000 mg | DELAYED_RELEASE_CAPSULE | Freq: Every day | ORAL | Status: DC

## 2012-02-28 NOTE — Telephone Encounter (Signed)
Received faxed refill request from pharmacy Refill sent electronically to pharmacy. 

## 2012-04-24 ENCOUNTER — Encounter (INDEPENDENT_AMBULATORY_CARE_PROVIDER_SITE_OTHER): Payer: Self-pay | Admitting: General Surgery

## 2012-07-04 ENCOUNTER — Other Ambulatory Visit: Payer: Self-pay | Admitting: Family Medicine

## 2012-07-04 NOTE — Telephone Encounter (Signed)
LMOVM to schedule CPE. 

## 2012-07-04 NOTE — Telephone Encounter (Signed)
Sent. Schedule a CPE.

## 2012-07-04 NOTE — Telephone Encounter (Signed)
Patient has not been seen by you in some time, no upcoming appts scheduled.  Please advise.

## 2012-07-12 ENCOUNTER — Ambulatory Visit (INDEPENDENT_AMBULATORY_CARE_PROVIDER_SITE_OTHER): Payer: 59 | Admitting: General Surgery

## 2012-07-25 ENCOUNTER — Other Ambulatory Visit: Payer: Self-pay

## 2012-07-25 NOTE — Telephone Encounter (Signed)
Pt request refill flonase. Not sure if Dr Para March has refilled before. Pt scheduled CPX 09/20/12.Please advise.

## 2012-07-26 MED ORDER — FLUTICASONE PROPIONATE 50 MCG/ACT NA SUSP: 2.0000 | Freq: Every day | NASAL | Status: DC

## 2012-07-26 NOTE — Telephone Encounter (Signed)
Sent!

## 2012-08-12 ENCOUNTER — Ambulatory Visit (INDEPENDENT_AMBULATORY_CARE_PROVIDER_SITE_OTHER): Payer: 59 | Admitting: General Surgery

## 2012-08-16 ENCOUNTER — Ambulatory Visit (INDEPENDENT_AMBULATORY_CARE_PROVIDER_SITE_OTHER): Payer: 59 | Admitting: General Surgery

## 2012-08-16 ENCOUNTER — Encounter (INDEPENDENT_AMBULATORY_CARE_PROVIDER_SITE_OTHER): Payer: Self-pay | Admitting: General Surgery

## 2012-08-16 VITALS — BP 136/84 | HR 72 | Temp 97.8°F | Resp 16 | Ht 61.0 in | Wt 223.1 lb

## 2012-08-16 DIAGNOSIS — Z853 Personal history of malignant neoplasm of breast: Secondary | ICD-10-CM

## 2012-08-16 NOTE — Progress Notes (Signed)
Subjective:     Patient ID: Anita Cooper, female   DOB: 02/18/1951, 61 y.o.   MRN: [id number]  HPI This is a 61 year old female who had a stage 0 right breast cancer in early 2011 that was treated with a lumpectomy and sentinel node biopsy. She has completed radiation therapy as well. She comes in today for her six-month followup. She has no complaints referable to her breast right now. She has no tenderness, masses, or any discharge. She has a recent mammogram that was completed in August of 2013 there was a 6 month followup that showed a stable appearance of the lumpectomy site in the right breast without any of some suspicious microcalcifications or any masses. There is unchanged skin thickening likely related to prior radiation. She is due for a bilateral diagnostic mammogram in February of 2014. Since I last seen her she is doing well. She continues to work and she has also been to [employer] with her granddaughter.   Review of Systems     Objective:   Physical Exam  Vitals reviewed. Constitutional: She appears well-developed and well-nourished.  Pulmonary/Chest: Right breast exhibits inverted nipple (longstanding) and skin change (some stable thickening after xrt). Right breast exhibits no mass, no nipple discharge and no tenderness. Left breast exhibits no inverted nipple, no mass, no nipple discharge, no skin change and no tenderness. Breasts are symmetrical.    Lymphadenopathy:    She has no cervical adenopathy.    She has no axillary adenopathy.       Right: No supraclavicular adenopathy present.       Left: No supraclavicular adenopathy present.       Assessment:     History stage 0 right breast cancer treated with lump/sn/xrt   Plan:     She has no clinical evidence of recurrence. She is due for a mammogram in early 2014. I will plan on seeing her back in 6 months. We discussed followup for an exam every 6 months until she is 5 years out. She will call him back sooner if  needed.

## 2012-08-16 NOTE — Patient Instructions (Signed)
Breast Self-Awareness  Practicing breast self-awareness may pick up problems early, prevent significant medical complications, and possibly save your life. By practicing breast self-awareness, you can become familiar with how your breasts look and feel and if your breasts are changing. This allows you to notice changes early. It can also offer you some reassurance that your breast health is good. One way to learn what is normal for your breasts and whether your breasts are changing is to do a breast self-exam.  If you find a lump or something that was not present in the past, it is best to contact your caregiver right away. Other findings that should be evaluated by your caregiver include nipple discharge, especially if it is bloody; skin changes or reddening; areas where the skin seems to be pulled in (retracted); or new lumps and bumps. Breast pain is seldom associated with cancer (malignancy), but should also be evaluated by a caregiver.  BREAST SELF-EXAM  The best time to examine your breasts is 5 7 days after your menstrual period is over. During menstruation, the breasts are lumpier, and it may be more difficult to pick up changes. If you do not menstruate, have reached menopause, or had your uterus removed (hysterectomy), you should examine your breasts at regular intervals, such as monthly. If you are breastfeeding, examine your breasts after a feeding or after using a breast pump. Breast implants do not decrease the risk for lumps or tumors, so continue to perform breast self-exams as recommended. Talk to your caregiver about how to determine the difference between the implant and breast tissue. Also, talk about the amount of pressure you should use during the exam. Over time, you will become more familiar with the variations of your breasts and more comfortable with the exam. A breast self-exam requires you to remove all your clothes above the waist.    Look at your breasts and nipples. Stand in front of  a mirror in a room with good lighting. With your hands on your hips, push your hands firmly downward. Look for a difference in shape, contour, and size from one breast to the other (asymmetry). Asymmetry includes puckers, dips, or bumps. Also, look for skin changes, such as reddened or scaly areas on the breasts. Look for nipple changes, such as discharge, dimpling, repositioning, or redness.   Carefully feel your breasts. This is best done either in the shower or tub while using soapy water or when flat on your back. Place the arm (on the side of the breast you are examining) above your head. Use the pads (not the fingertips) of your three middle fingers on your opposite hand to feel your breasts. Start in the underarm area and use  inch (2 cm) overlapping circles to feel your breast. Use 3 different levels of pressure (light, medium, and firm pressure) at each circle before moving to the next circle. The light pressure is needed to feel the tissue closest to the skin. The medium pressure will help to feel breast tissue a little deeper, while the firm pressure is needed to feel the tissue close to the ribs. Continue the overlapping circles, moving downward over the breast until you feel your ribs below your breast. Then, move one finger-width towards the center of the body. Continue to use the  inch (2 cm) overlapping circles to feel your breast as you move slowly up toward the collar bone (clavicle) near the base of the neck. Continue the up and down exam using all 3 pressures   until you reach the middle of the chest. Do this with each breast, carefully feeling for lumps or changes.   Keep a written record with breast changes or normal findings for each breast. By writing this information down, you do not need to depend only on memory for size, tenderness, or location. Write down where you are in your menstrual cycle, if you are still menstruating.   Breast tissue can have some lumps or thick tissue. However,  see your caregiver if you find anything that concerns you.   SEEK MEDICAL CARE IF:   You see a change in shape, contour, or size of your breasts or nipples.    You see skin changes, such as reddened or scaly areas on the breasts or nipples.    You have an unusual discharge from your nipples.    You feel a new lump or unusually thick areas.   Document Released: 10/30/2005 Document Revised: 04/30/2012 Document Reviewed: 02/14/2012  [employer] Patient Information 2013 [employer].

## 2012-09-20 ENCOUNTER — Encounter: Payer: 59 | Admitting: Family Medicine

## 2012-12-03 ENCOUNTER — Other Ambulatory Visit: Payer: Self-pay | Admitting: Family Medicine

## 2012-12-03 ENCOUNTER — Ambulatory Visit (INDEPENDENT_AMBULATORY_CARE_PROVIDER_SITE_OTHER): Payer: 59 | Admitting: Family Medicine

## 2012-12-03 ENCOUNTER — Encounter: Payer: Self-pay | Admitting: *Deleted

## 2012-12-03 ENCOUNTER — Encounter: Payer: Self-pay | Admitting: Family Medicine

## 2012-12-03 VITALS — BP 152/84 | HR 88 | Temp 98.1°F | Ht 61.0 in | Wt 223.0 lb

## 2012-12-03 DIAGNOSIS — E7800 Pure hypercholesterolemia, unspecified: Secondary | ICD-10-CM

## 2012-12-03 DIAGNOSIS — Z0000 Encounter for general adult medical examination without abnormal findings: Secondary | ICD-10-CM

## 2012-12-03 DIAGNOSIS — Z853 Personal history of malignant neoplasm of breast: Secondary | ICD-10-CM

## 2012-12-03 DIAGNOSIS — I10 Essential (primary) hypertension: Secondary | ICD-10-CM

## 2012-12-03 DIAGNOSIS — R739 Hyperglycemia, unspecified: Secondary | ICD-10-CM

## 2012-12-03 DIAGNOSIS — K219 Gastro-esophageal reflux disease without esophagitis: Secondary | ICD-10-CM

## 2012-12-03 LAB — LIPID PANEL
Cholesterol: 212 mg/dL — ABNORMAL HIGH (ref 0–200)
HDL: 39.6 mg/dL (ref >39.00)
Total CHOL/HDL Ratio: 5
VLDL: 30 mg/dL (ref 0.0–40.0)

## 2012-12-03 LAB — COMPREHENSIVE METABOLIC PANEL
Alkaline Phosphatase: 52 U/L (ref 39–117)
CO2: 31 mEq/L (ref 19–32)
Creatinine, Ser: 0.9 mg/dL (ref 0.4–1.2)
GFR: 66.05 mL/min (ref >60.00)
Glucose, Bld: 111 mg/dL — ABNORMAL HIGH (ref 70–99)
Sodium: 138 mEq/L (ref 135–145)
Total Bilirubin: 0.6 mg/dL (ref 0.3–1.2)
Total Protein: 7.8 g/dL (ref 6.0–8.3)

## 2012-12-03 LAB — LDL CHOLESTEROL, DIRECT: Direct LDL: 141.2 mg/dL

## 2012-12-03 MED ORDER — FLUTICASONE PROPIONATE 50 MCG/ACT NA SUSP: 2.0000 | Freq: Every day | NASAL | Status: DC

## 2012-12-03 MED ORDER — OLMESARTAN MEDOXOMIL-HCTZ 40-25 MG PO TABS: 0.5000 | ORAL_TABLET | Freq: Every day | ORAL | Status: DC

## 2012-12-03 MED ORDER — ESOMEPRAZOLE MAGNESIUM 40 MG PO CPDR: DELAYED_RELEASE_CAPSULE | ORAL | Status: DC

## 2012-12-03 NOTE — Assessment & Plan Note (Signed)
Per surgery clinic. 

## 2012-12-03 NOTE — Assessment & Plan Note (Signed)
No meds currently, will work on diet and exercise.  See notes on labs.

## 2012-12-03 NOTE — Progress Notes (Signed)
CPE- See plan.  Routine anticipatory guidance given to patient.  See health maintenance. Diet and exercise discussed, she had 'fallen off' on both.  She is having trouble making time for either.  Discussed.   Tetanus 2005 Shingles shot Flu shot done already Colonoscopy 2012 Pap smear last done a few years ago, she'll call about f/u with gynecology.  DXA not due yet.   Living will d/w pt.  Encouraged.  Husband would be designated if incapacitated.    Breast cancer in remission, followed by surgery w/o new masses, lesions, lumps.    Hypertension:    Using medication without problems or lightheadedness: yes Chest pain with exertion:no Edema:no Short of breath:no Average home BPs: not often checked, had been normal at surgery clinic on last check.    GERD controlled with PPI, but she was asking about cheaper alternatives.    PMH and SH reviewed  Meds, vitals, and allergies reviewed.   ROS: See HPI.  Otherwise negative.    GEN: nad, alert and oriented HEENT: mucous membranes moist NECK: supple w/o LA CV: rrr. PULM: ctab, no inc wob ABD: soft, +bs EXT: no edema SKIN: no acute rash

## 2012-12-03 NOTE — Assessment & Plan Note (Signed)
Controlled, continue as is with meds and work on diet and exercise.  See notes on labs.  She agrees.

## 2012-12-03 NOTE — Assessment & Plan Note (Signed)
She can try 2 x20mg  of otc prilosec.  O/w continue nexium.

## 2012-12-03 NOTE — Assessment & Plan Note (Signed)
Routine anticipatory guidance given to patient.  See health maintenance. Diet and exercise discussed, she had 'fallen off' on both.  She is having trouble making time for either.  Discussed.   Tetanus 2005 Shingles shot Flu shot done already Colonoscopy 2012 Pap smear last done a few years ago, she'll call about f/u with gynecology.  DXA not due yet.   Living will d/w pt.  Encouraged.  Husband would be designated if incapacitated.

## 2012-12-03 NOTE — Patient Instructions (Addendum)
Go to the lab on the way out.  We'll contact you with your lab report.  Check with your insurance to see if they will cover the shingles shot. I would consider omeprazole 20mg , 2 tabs a day.   I would start back with your diet.  Try to get more exercise.   If your BP stays up, then notify me.

## 2013-01-22 ENCOUNTER — Telehealth: Payer: Self-pay

## 2013-01-22 ENCOUNTER — Encounter (INDEPENDENT_AMBULATORY_CARE_PROVIDER_SITE_OTHER): Payer: Self-pay | Admitting: General Surgery

## 2013-01-22 NOTE — Telephone Encounter (Signed)
Pt has scheduled diagnostic mammogram at [facility] on [facility] and request doctors order faxed to [facility].Please advise.

## 2013-01-22 NOTE — Telephone Encounter (Signed)
Please print and send a letter for diagnostic mammogram due to h/o breast cancer.

## 2013-01-23 ENCOUNTER — Encounter: Payer: Self-pay | Admitting: *Deleted

## 2013-01-23 NOTE — Telephone Encounter (Signed)
Letter faxed.

## 2013-02-17 ENCOUNTER — Encounter: Payer: Self-pay | Admitting: Family Medicine

## 2013-02-17 ENCOUNTER — Telehealth (INDEPENDENT_AMBULATORY_CARE_PROVIDER_SITE_OTHER): Payer: Self-pay

## 2013-02-17 NOTE — Telephone Encounter (Signed)
LMOM for pt to call me to see if she is ok with the ltf appt that she is scheduled for with Dr Dwain Sarna or do I need to move her appt with Dr Dwain Sarna.

## 2013-02-17 NOTE — Telephone Encounter (Signed)
Pt does want to be seen earlier than her appt on 6/2 so I switched her appt to 5/2. The pt is ok with this appt. The pt goes on 4/4 to get her mgm done at [facility].

## 2013-03-14 ENCOUNTER — Ambulatory Visit (INDEPENDENT_AMBULATORY_CARE_PROVIDER_SITE_OTHER): Payer: 59 | Admitting: General Surgery

## 2013-03-14 ENCOUNTER — Encounter (INDEPENDENT_AMBULATORY_CARE_PROVIDER_SITE_OTHER): Payer: Self-pay | Admitting: General Surgery

## 2013-03-14 VITALS — BP 158/82 | HR 80 | Resp 18 | Ht 61.0 in | Wt 224.0 lb

## 2013-03-14 DIAGNOSIS — Z853 Personal history of malignant neoplasm of breast: Secondary | ICD-10-CM

## 2013-03-16 NOTE — Progress Notes (Signed)
Subjective:     Patient ID: Anita Cooper, female   DOB: [birth year], 61 y.o.   MRN: [id number]  HPI 69 yof who underwent right lumpectomy/sn for stage 0 breast cancer followed by xrt.  She has done well since then without any real significant compaints.  She is working.  She has no real issues with either breast right now and is otherwise remaining healthy.  She underwent recent mm at [facility] which is birads 3.  On her left breast mm she had an asymmetry noted that only persisted in one view.  This is only on MLO view but does not show up on the CC view.  The right side shows stable microcalcifications in the lumpectomy bed.  There is a small mass near the right nipple seen on u/s that appears to correlate with her mm and is stable.  Review of Systems     Objective:   Physical Exam  Vitals reviewed. Constitutional: She appears well-developed and well-nourished.  Pulmonary/Chest: Right breast exhibits inverted nipple (longstanding bilateral inverted nipples). Right breast exhibits no mass, no nipple discharge, no skin change and no tenderness. Left breast exhibits inverted nipple. Left breast exhibits no mass, no nipple discharge and no skin change. Breasts are symmetrical.    Lymphadenopathy:    She has no cervical adenopathy.    She has no axillary adenopathy.       Right: No supraclavicular adenopathy present.       Left: No supraclavicular adenopathy present.       Assessment:    History of right breast dcis s/p bct/xrt    Plan:     She does not have any clinical evidence of recurrence.  I think reasonable to follow up mm/us as Dr Anne Hahn has recommended.  She is comfortable with that too.  She is otherwise well.  I will plan on continuing her exams.  She would like to return after her follow up mm/us and I will see her then.

## 2013-03-24 ENCOUNTER — Telehealth: Payer: Self-pay

## 2013-03-24 NOTE — Telephone Encounter (Signed)
Pt called for status of PA on Nexium; I called [facility] and requested PA form; form is on Dr Lianne Bushy desk and pt was informed of PA process.

## 2013-03-24 NOTE — Telephone Encounter (Signed)
I'll work on the hard copy.  Thanks.  

## 2013-03-25 NOTE — Telephone Encounter (Signed)
Additional PA form faxed to our office for completion; form on Dr Lianne Bushy desk.

## 2013-03-25 NOTE — Telephone Encounter (Signed)
I'll work on that form, too.

## 2013-03-28 ENCOUNTER — Encounter: Payer: Self-pay | Admitting: Family Medicine

## 2013-03-28 NOTE — Telephone Encounter (Signed)
Approval letter faxed to [facility] and on Dr Armanda Heritage desk for signature and scanning.

## 2013-04-01 ENCOUNTER — Telehealth: Payer: Self-pay | Admitting: *Deleted

## 2013-04-01 MED ORDER — LOSARTAN POTASSIUM-HCTZ 50-12.5 MG PO TABS: 1.0000 | ORAL_TABLET | Freq: Every day | ORAL | Status: DC

## 2013-04-01 NOTE — Telephone Encounter (Signed)
Patient advised.   She says she only took one half tablet of the Benicar-HCT.  Were you aware of that?

## 2013-04-01 NOTE — Telephone Encounter (Signed)
Yes, I accounted for that.  Thanks.

## 2013-04-01 NOTE — Telephone Encounter (Signed)
Patient advised.

## 2013-04-01 NOTE — Telephone Encounter (Signed)
Change to losartan/hctz.  rx sent.  Have her check BP and report back if consistently >140/90 or if lightheaded.  Thanks.

## 2013-04-01 NOTE — Telephone Encounter (Signed)
Received a fax from pharmacy that pt's insurance no longer covers benicar hct, they will cover "benzapril-hctz, fosinopril-hctz, lisinopril-hctz, losartan-hctz, moexipril-hctz, quinapril-hctz, or any other generic ace or arb as appropiate" are any of these medications appropiate to switch to? If not we will need to start a prior auth.

## 2013-04-02 ENCOUNTER — Other Ambulatory Visit: Payer: 59

## 2013-04-25 ENCOUNTER — Ambulatory Visit (INDEPENDENT_AMBULATORY_CARE_PROVIDER_SITE_OTHER): Payer: 59 | Admitting: General Surgery

## 2013-06-03 ENCOUNTER — Telehealth: Payer: Self-pay

## 2013-06-03 NOTE — Telephone Encounter (Signed)
Patient advised. Appointment scheduled.  

## 2013-06-03 NOTE — Telephone Encounter (Signed)
Pt said 2 weeks after started Losartan HCTZ began with itching and hives all over body which have been continuous;no difficulty breathing. pt has seen dermatologist and has taken prednisone with no relief. Could this be allergic reaction to Losartan HCTZ.[facility]. Please advise. Pt request cb.

## 2013-06-03 NOTE — Telephone Encounter (Signed)
Have her stop it and come in for OV this week to f/u BP and discuss.  I would keep taking claritin for the itching.  Thanks.

## 2013-06-05 ENCOUNTER — Other Ambulatory Visit: Payer: 59

## 2013-06-10 ENCOUNTER — Encounter: Payer: Self-pay | Admitting: Family Medicine

## 2013-06-10 ENCOUNTER — Ambulatory Visit (INDEPENDENT_AMBULATORY_CARE_PROVIDER_SITE_OTHER): Payer: 59 | Admitting: Family Medicine

## 2013-06-10 VITALS — BP 146/86 | HR 85 | Temp 98.1°F | Wt 224.0 lb

## 2013-06-10 DIAGNOSIS — L299 Pruritus, unspecified: Secondary | ICD-10-CM

## 2013-06-10 DIAGNOSIS — I10 Essential (primary) hypertension: Secondary | ICD-10-CM

## 2013-06-10 MED ORDER — ESOMEPRAZOLE MAGNESIUM 40 MG PO CPDR: DELAYED_RELEASE_CAPSULE | ORAL | Status: DC

## 2013-06-10 MED ORDER — ESOMEPRAZOLE MAGNESIUM 20 MG PO CPDR: 40.0000 mg | DELAYED_RELEASE_CAPSULE | Freq: Every day | ORAL | Status: DC

## 2013-06-10 MED ORDER — HYDROXYZINE PAMOATE 25 MG PO CAPS: 25.0000 mg | ORAL_CAPSULE | Freq: Three times a day (TID) | ORAL | Status: DC | PRN

## 2013-06-10 NOTE — Patient Instructions (Signed)
Try taking hydroxyzine for the itching.  If that doesn't help, then try zantac 150mg  twice a day with the claritin.  See Shirlee Limerick about your referral before you leave today. Take care.  Stay off the BP medicine for now and give me an update in about 1 week.

## 2013-06-10 NOTE — Progress Notes (Signed)
She had a yeast infection.  She was itchy, seen at [facility]. Given oral meds, diflucan.  She improved but still had some itching diffusely.  Was seen at derm clinic.  It was likely an allergic reaction. Was given prednisone and clobetasol per derm.  She was improved, but as the pred taper ended she had more itching.  The BP med was a new addition.  That was the old change.  Has stopped BP med for 1 week.  The itching continues in the meantime.  She is still taking claritin but can't tolerate benadryl.   Still with diffuse itching.  No FCNAVD.  No lip, tongue edema.  Throat feels a little scratchy but this isn't atypical for patient. No dysphagia.    Meds, vitals, and allergies reviewed.   ROS: See HPI.  Otherwise, noncontributory.  GEN: nad, alert and oriented HEENT: mucous membranes moist NECK: supple w/o LA CV: rrr PULM: ctab, no inc wob ABD: soft, +bs EXT: no edema SKIN: faint blanching red maculopapular irregular rash bilaterally

## 2013-06-11 NOTE — Assessment & Plan Note (Signed)
With rash.  Off BP med now.  Try taking hydroxyzine for the itching. If that doesn't help, then try zantac 150mg  twice a day with the claritin.  We can refer to allergy clinic on the outside chance this isn't related to her BP meds.  She'll update me about the BP next week.  Okay BP for now.

## 2013-06-13 ENCOUNTER — Ambulatory Visit: Payer: 59 | Admitting: Family Medicine

## 2013-06-26 ENCOUNTER — Other Ambulatory Visit (INDEPENDENT_AMBULATORY_CARE_PROVIDER_SITE_OTHER): Payer: 59

## 2013-06-26 DIAGNOSIS — E7800 Pure hypercholesterolemia, unspecified: Secondary | ICD-10-CM

## 2013-06-26 DIAGNOSIS — Z0000 Encounter for general adult medical examination without abnormal findings: Secondary | ICD-10-CM

## 2013-06-26 DIAGNOSIS — I10 Essential (primary) hypertension: Secondary | ICD-10-CM

## 2013-06-26 LAB — COMPREHENSIVE METABOLIC PANEL
ALT: 42 U/L — ABNORMAL HIGH (ref 0–35)
CO2: 31 mEq/L (ref 19–32)
Calcium: 9.3 mg/dL (ref 8.4–10.5)
Chloride: 103 mEq/L (ref 96–112)
GFR: 65.93 mL/min (ref >60.00)
Glucose, Bld: 128 mg/dL — ABNORMAL HIGH (ref 70–99)
Sodium: 138 mEq/L (ref 135–145)
Total Protein: 7.6 g/dL (ref 6.0–8.3)

## 2013-06-26 LAB — LIPID PANEL
Total CHOL/HDL Ratio: 5
Triglycerides: 93 mg/dL (ref 0.0–149.0)

## 2013-07-04 ENCOUNTER — Other Ambulatory Visit: Payer: Self-pay | Admitting: Family Medicine

## 2013-07-04 ENCOUNTER — Ambulatory Visit (INDEPENDENT_AMBULATORY_CARE_PROVIDER_SITE_OTHER): Payer: 59 | Admitting: Family Medicine

## 2013-07-04 ENCOUNTER — Encounter: Payer: Self-pay | Admitting: Family Medicine

## 2013-07-04 VITALS — BP 188/92 | HR 90 | Temp 97.7°F | Wt 224.2 lb

## 2013-07-04 DIAGNOSIS — E119 Type 2 diabetes mellitus without complications: Secondary | ICD-10-CM | POA: Insufficient documentation

## 2013-07-04 DIAGNOSIS — R7309 Other abnormal glucose: Secondary | ICD-10-CM

## 2013-07-04 DIAGNOSIS — E7800 Pure hypercholesterolemia, unspecified: Secondary | ICD-10-CM

## 2013-07-04 DIAGNOSIS — I10 Essential (primary) hypertension: Secondary | ICD-10-CM

## 2013-07-04 DIAGNOSIS — R739 Hyperglycemia, unspecified: Secondary | ICD-10-CM

## 2013-07-04 MED ORDER — ESOMEPRAZOLE MAGNESIUM 20 MG PO CPDR: 40.0000 mg | DELAYED_RELEASE_CAPSULE | Freq: Every day | ORAL | Status: DC

## 2013-07-04 MED ORDER — OLMESARTAN MEDOXOMIL-HCTZ 40-25 MG PO TABS: 0.5000 | ORAL_TABLET | Freq: Every day | ORAL | Status: DC

## 2013-07-04 NOTE — Assessment & Plan Note (Signed)
Discussed diet and exercise.  Recheck periodically.

## 2013-07-04 NOTE — Patient Instructions (Addendum)
Change back to benicar HCTZ.  1/2 pill a day.  If BP >140/>90 after 1 week, then increase to 1 tab a day.   Labs in about 10 day if BP controlled (or about 1 week after med increase if needed).   [place].  Call with BP update.   Notify me and stop benicar if the itching worsens. Physical in about 6 months.

## 2013-07-04 NOTE — Assessment & Plan Note (Signed)
Change back to benicar HCTZ.  1/2 pill a day.  If BP >140/>90 after 1 week, then increase to 1 tab a day.   Labs in about 10 day if BP controlled (or about 1 week after med increase if needed).   Call with BP update.   Notify me and stop benicar if the itching worsens.

## 2013-07-04 NOTE — Assessment & Plan Note (Signed)
Will work on diet.  No new meds for lipids.

## 2013-07-04 NOTE — Progress Notes (Signed)
She is off the BP medicine (losartan), possibly cause of itching.  She isn't needing hydroxyzine as much now.   Didn't itch on benicar.  She was on HCTZ with both meds.  She didn't want to try the losartan again and this is reasonable.  No airway sx or red flag sx.  BP is up.    Using medication without problems or lightheadedness: see above, off meds Chest pain with exertion:no Edema:no Short of breath: at baseline, likely from deconditioning. She has had this prev when not exercising routinely.    Elevated Cholesterol: No meds Diet compliance: "not good."  Exercise:"not good."   Other complaints: routine disrupted by itching, hopefully getting better.    Sugar was up, discussed diet, exercise, weight.  Meds, vitals, and allergies reviewed.   PMH and SH reviewed  ROS: See HPI.  Otherwise negative.    GEN: nad, alert and oriented HEENT: mucous membranes moist NECK: supple w/o LA CV: rrr. PULM: ctab, no inc wob ABD: soft, +bs EXT: no edema SKIN: no acute rash

## 2013-07-15 ENCOUNTER — Other Ambulatory Visit (INDEPENDENT_AMBULATORY_CARE_PROVIDER_SITE_OTHER): Payer: 59

## 2013-07-15 DIAGNOSIS — I10 Essential (primary) hypertension: Secondary | ICD-10-CM

## 2013-07-15 LAB — BASIC METABOLIC PANEL
Calcium: 9.2 mg/dL (ref 8.4–10.5)
Creatinine, Ser: 0.8 mg/dL (ref 0.4–1.2)
GFR: 73.22 mL/min (ref >60.00)

## 2013-07-17 ENCOUNTER — Other Ambulatory Visit: Payer: Self-pay | Admitting: Family Medicine

## 2013-07-17 MED ORDER — OLMESARTAN MEDOXOMIL-HCTZ 40-25 MG PO TABS: 0.5000 | ORAL_TABLET | Freq: Every day | ORAL | Status: DC

## 2013-08-13 LAB — HM PAP SMEAR: HM Pap smear: NORMAL

## 2013-08-22 LAB — HM MAMMOGRAPHY: HM Mammogram: NORMAL

## 2013-08-25 ENCOUNTER — Encounter: Payer: Self-pay | Admitting: Family Medicine

## 2013-08-25 ENCOUNTER — Ambulatory Visit (INDEPENDENT_AMBULATORY_CARE_PROVIDER_SITE_OTHER): Payer: 59 | Admitting: General Surgery

## 2013-08-29 ENCOUNTER — Ambulatory Visit (INDEPENDENT_AMBULATORY_CARE_PROVIDER_SITE_OTHER): Payer: 59 | Admitting: General Surgery

## 2013-09-01 ENCOUNTER — Ambulatory Visit (INDEPENDENT_AMBULATORY_CARE_PROVIDER_SITE_OTHER): Payer: 59 | Admitting: General Surgery

## 2013-09-01 ENCOUNTER — Encounter (INDEPENDENT_AMBULATORY_CARE_PROVIDER_SITE_OTHER): Payer: Self-pay | Admitting: General Surgery

## 2013-09-01 VITALS — BP 140/80 | HR 100 | Temp 98.4°F | Resp 16 | Ht 61.0 in | Wt 228.2 lb

## 2013-09-01 DIAGNOSIS — D0511 Intraductal carcinoma in situ of right breast: Secondary | ICD-10-CM

## 2013-09-01 DIAGNOSIS — D0590 Unspecified type of carcinoma in situ of unspecified breast: Secondary | ICD-10-CM

## 2013-09-02 NOTE — Progress Notes (Signed)
Subjective:     Patient ID: Anita Cooper, female   DOB: 1951/01/14, 61 y.o.   MRN: [id number]  HPI 89 yof who underwent right lumpectomy/sn for stage 0 breast cancer followed by xrt. She has done well since then without any real significant compaints. She is working at [employer] still. She has no real issues with either breast right now and is otherwise remaining healthy. She underwent recent mm at [facility] which is birads 3. . There is a small mass near the right nipple seen on u/s that appears to correlate with her mm and is stable for some time now.She has no concerns on her breast exam.  She has a granddaughter due in December.   Review of Systems     Objective:   Physical Exam  Pulmonary/Chest:     Physical Exam  Vitals reviewed.  Constitutional: She appears well-developed and well-nourished.  Pulmonary/Chest: Right breast exhibits inverted nipple (longstanding bilateral inverted nipples). Right breast exhibits no mass, no nipple discharge, no skin change and no tenderness. Left breast exhibits inverted nipple. Left breast exhibits no mass, no nipple discharge and no skin change. Breasts are symmetrical.    Lymphadenopathy:  She has no cervical adenopathy.  She has no axillary adenopathy.  Right: No supraclavicular adenopathy present.  Left: No supraclavicular adenopathy present.      Assessment:     History stage 0 right breast cancer     Plan:     She has no clinical evidence of recurrence. She is a very stable lobulated nodule that is right below her areola complex on her right duct is benign it is fine to be followed by imaging. She's going to continue her mammograms as scheduled, self breast exams, and I will see her back in about 6 months for another exam after her next mammogram.

## 2013-09-17 ENCOUNTER — Telehealth: Payer: Self-pay

## 2013-09-17 NOTE — Telephone Encounter (Signed)
I would get the tdap either here or at the pharmacy.  If she needs to get here, please put her on the RN schedule.  O/w at pharmacy. Would also get a flu shot if not already done, either at pharmacy or here.  And congrats on the grandchild.

## 2013-09-17 NOTE — Telephone Encounter (Signed)
Pt left v/m wanting to ck with Dr Para March about getting shingles vaccine, tdap and pneumonia vaccine. Pt has CPX scheduled 01/06/2014. Phone system is down now will try to call pt later.

## 2013-09-17 NOTE — Telephone Encounter (Signed)
Spoke with pt and she will ck with ins co about coverage for shingles vaccine and discuss the pneumonia vaccine with Dr Para March at her CPX appt. Pt is expecting a new grandchild in Dec and would like to get tdap.Please advise.

## 2013-09-18 ENCOUNTER — Other Ambulatory Visit: Payer: Self-pay

## 2013-09-19 NOTE — Telephone Encounter (Signed)
Left detailed message on voicemail.  

## 2013-12-11 ENCOUNTER — Telehealth: Payer: Self-pay

## 2013-12-11 MED ORDER — OSELTAMIVIR PHOSPHATE 75 MG PO CAPS: 75.0000 mg | ORAL_CAPSULE | Freq: Every day | ORAL | Status: DC

## 2013-12-11 NOTE — Telephone Encounter (Signed)
Take daily- not bid- for 10 days.   If she hasn't had a flu shot, then I would get that too.  congrats on the baby.  rx sent.

## 2013-12-11 NOTE — Telephone Encounter (Signed)
Pt left v/m; pt is helping take care of new grandbaby and pts daughter was dx 12/10/13 with the flu; pt wants to know if can get tamiflu as preventative measure to [facility]. Pt request cb.

## 2013-12-11 NOTE — Telephone Encounter (Signed)
Patient advised.

## 2014-01-06 ENCOUNTER — Encounter: Payer: 59 | Admitting: Family Medicine

## 2014-01-19 ENCOUNTER — Other Ambulatory Visit: Payer: Self-pay | Admitting: Family Medicine

## 2014-01-29 ENCOUNTER — Encounter: Payer: Self-pay | Admitting: Family Medicine

## 2014-01-29 ENCOUNTER — Ambulatory Visit (INDEPENDENT_AMBULATORY_CARE_PROVIDER_SITE_OTHER): Payer: 59 | Admitting: Family Medicine

## 2014-01-29 VITALS — BP 126/78 | HR 83 | Temp 98.0°F | Ht 61.0 in | Wt 224.8 lb

## 2014-01-29 DIAGNOSIS — J309 Allergic rhinitis, unspecified: Secondary | ICD-10-CM

## 2014-01-29 DIAGNOSIS — Z0000 Encounter for general adult medical examination without abnormal findings: Secondary | ICD-10-CM

## 2014-01-29 DIAGNOSIS — K219 Gastro-esophageal reflux disease without esophagitis: Secondary | ICD-10-CM

## 2014-01-29 DIAGNOSIS — I10 Essential (primary) hypertension: Secondary | ICD-10-CM

## 2014-01-29 DIAGNOSIS — Z853 Personal history of malignant neoplasm of breast: Secondary | ICD-10-CM

## 2014-01-29 LAB — COMPREHENSIVE METABOLIC PANEL
ALT: 99 U/L — AB (ref 0–35)
AST: 80 U/L — ABNORMAL HIGH (ref 0–37)
Albumin: 4.2 g/dL (ref 3.5–5.2)
Alkaline Phosphatase: 57 U/L (ref 39–117)
BUN: 11 mg/dL (ref 6–23)
CALCIUM: 9.4 mg/dL (ref 8.4–10.5)
CO2: 29 meq/L (ref 19–32)
Chloride: 98 mEq/L (ref 96–112)
Creatinine, Ser: 0.9 mg/dL (ref 0.4–1.2)
GFR: 69.27 mL/min (ref >60.00)
Glucose, Bld: 152 mg/dL — ABNORMAL HIGH (ref 70–99)
POTASSIUM: 3.6 meq/L (ref 3.5–5.1)
SODIUM: 138 meq/L (ref 135–145)
TOTAL PROTEIN: 7.6 g/dL (ref 6.0–8.3)
Total Bilirubin: 0.9 mg/dL (ref 0.3–1.2)

## 2014-01-29 MED ORDER — FLUTICASONE PROPIONATE 50 MCG/ACT NA SUSP: NASAL | Status: DC

## 2014-01-29 NOTE — Assessment & Plan Note (Signed)
Controlled. Continue as is. 

## 2014-01-29 NOTE — Assessment & Plan Note (Signed)
Controlled, see notes on labs.  No change in meds.  She'll work on diet and exercise.

## 2014-01-29 NOTE — Assessment & Plan Note (Signed)
Per surgery and gyn clinics.

## 2014-01-29 NOTE — Assessment & Plan Note (Signed)
Controlled with flonase.  Continue as is.

## 2014-01-29 NOTE — Patient Instructions (Signed)
Go to the lab on the way out.  We'll contact you with your lab report. I would get a flu shot each fall.   Call ortho about your shoulder.   Try to ease back into exercise as your shoulder allows.  Glad to see you.  Take care.

## 2014-01-29 NOTE — Assessment & Plan Note (Signed)
Routine anticipatory guidance given to patient.  See health maintenance. Tetanus 2014 Flu 2014 Shingles 2014 Colonoscopy 2012 Pap 2014 Mammogram 2014, she'll have 6 months follow up.  DXA 08/13/13- wnl Diet and exercise d/w pt.  Exercise limited by L shoulder pain.  She has L shoulder pain and she'll follow up with ortho about that.  She had gone through PT prev. She is thinking about weight watchers, online.   Living will d/w pt.  She has an appointment with a lawyer tomorrow.  Husband designated if patient were incapacitated.   She has a new grandchild.

## 2014-01-29 NOTE — Progress Notes (Signed)
Pre visit review using our clinic review tool, if applicable. No additional management support is needed unless otherwise documented below in the visit note.  CPE- See plan.  Routine anticipatory guidance given to patient.  See health maintenance. Tetanus 2014 Flu 2014 Shingles 2014 Colonoscopy 2012 Pap 2014 Mammogram 2014, she'll have 6 months follow up.  DXA 08/13/13- wnl Diet and exercise d/w pt.  Exercise limited by L shoulder pain.  She has L shoulder pain and she'll follow up with ortho about that.  She had gone through PT prev. She is thinking about weight watchers, online.   Living will d/w pt.  She has an appointment with a lawyer tomorrow.  Husband designated if patient were incapacitated.   She has a new grandchild.    Hypertension:    Using medication without problems or lightheadedness: yes Chest pain with exertion:no Edema:no Short of breath:no Average home BPs: similar to reading in clinic.    H/o mild transaminitis prev.  Due for repeat labs. No Gi sx, no jaundice.    PMH and SH reviewed  Meds, vitals, and allergies reviewed.   ROS: See HPI.  Otherwise negative.    GEN: nad, alert and oriented HEENT: mucous membranes moist NECK: supple w/o LA CV: rrr. PULM: ctab, no inc wob ABD: soft, +bs EXT: no edema SKIN: no acute rash

## 2014-01-30 ENCOUNTER — Other Ambulatory Visit: Payer: Self-pay | Admitting: Family Medicine

## 2014-01-30 ENCOUNTER — Telehealth: Payer: Self-pay | Admitting: Family Medicine

## 2014-01-30 DIAGNOSIS — R739 Hyperglycemia, unspecified: Secondary | ICD-10-CM

## 2014-01-30 NOTE — Telephone Encounter (Signed)
Relevant patient education assigned to patient using Emmi. ° °

## 2014-03-06 ENCOUNTER — Encounter: Payer: 59 | Admitting: Family Medicine

## 2014-03-13 ENCOUNTER — Encounter: Payer: Self-pay | Admitting: *Deleted

## 2014-03-13 ENCOUNTER — Encounter: Payer: Self-pay | Admitting: Family Medicine

## 2014-04-03 ENCOUNTER — Encounter (INDEPENDENT_AMBULATORY_CARE_PROVIDER_SITE_OTHER): Payer: Self-pay | Admitting: General Surgery

## 2014-04-03 ENCOUNTER — Ambulatory Visit (INDEPENDENT_AMBULATORY_CARE_PROVIDER_SITE_OTHER): Payer: 59 | Admitting: General Surgery

## 2014-04-03 VITALS — BP 134/78 | HR 95 | Temp 98.2°F | Ht 61.0 in | Wt 223.8 lb

## 2014-04-03 DIAGNOSIS — D0510 Intraductal carcinoma in situ of unspecified breast: Secondary | ICD-10-CM

## 2014-04-03 DIAGNOSIS — D0590 Unspecified type of carcinoma in situ of unspecified breast: Secondary | ICD-10-CM

## 2014-04-03 NOTE — Progress Notes (Signed)
Subjective:     Patient ID: Anita Cooper, female   DOB: 1951-03-05, 63 y.o.   MRN: [id number]  HPI  63 yof who underwent right lumpectomy/sn for stage 0 breast cancer followed by xrt. She has done well since then without any real significant compaints. She has no real issues with either breast right now and is otherwise remaining healthy. She underwent recent mm and u/s that is normal and is recommended for a repeat in one year. She has no concerns on her breast exam. She had a granddaughter in December.     Review of Systems     Objective:   Physical Exam  Vitals reviewed. Constitutional: She appears well-developed and well-nourished.  Neck: Neck supple.  Pulmonary/Chest: Right breast exhibits inverted nipple (longstanding). Right breast exhibits no mass, no nipple discharge, no skin change and no tenderness. Left breast exhibits no inverted nipple, no mass, no nipple discharge, no skin change and no tenderness.    Lymphadenopathy:    She has no cervical adenopathy.    She has no axillary adenopathy.       Right: No supraclavicular adenopathy present.       Left: No supraclavicular adenopathy present.       Assessment:     DCIS     Plan:     She has no clinical evidence of recurrence.  She's going to continue her mammograms as scheduled, self breast exams, and I will see her back in about 6 months for another exam after her next mammogram. At five years we can do an annual visit.

## 2014-05-25 ENCOUNTER — Other Ambulatory Visit (INDEPENDENT_AMBULATORY_CARE_PROVIDER_SITE_OTHER): Payer: 59

## 2014-05-25 DIAGNOSIS — R739 Hyperglycemia, unspecified: Secondary | ICD-10-CM

## 2014-05-25 DIAGNOSIS — R7309 Other abnormal glucose: Secondary | ICD-10-CM

## 2014-05-25 LAB — HEMOGLOBIN A1C: Hgb A1c MFr Bld: 8 % — ABNORMAL HIGH (ref 4.6–6.5)

## 2014-05-29 ENCOUNTER — Encounter: Payer: Self-pay | Admitting: Family Medicine

## 2014-05-29 ENCOUNTER — Ambulatory Visit (INDEPENDENT_AMBULATORY_CARE_PROVIDER_SITE_OTHER): Payer: 59 | Admitting: Family Medicine

## 2014-05-29 ENCOUNTER — Ambulatory Visit: Payer: 59 | Admitting: Family Medicine

## 2014-05-29 VITALS — BP 148/80 | HR 93 | Temp 97.6°F | Wt 224.0 lb

## 2014-05-29 DIAGNOSIS — E119 Type 2 diabetes mellitus without complications: Secondary | ICD-10-CM

## 2014-05-29 MED ORDER — OLMESARTAN MEDOXOMIL-HCTZ 40-25 MG PO TABS: 0.5000 | ORAL_TABLET | Freq: Every day | ORAL | Status: DC

## 2014-05-29 NOTE — Patient Instructions (Signed)
Get on the bike, try to cut some carbs, and recheck A1c in about 3 months at [facility] lab.  We'll go from there.  Take care.

## 2014-05-29 NOTE — Progress Notes (Signed)
Pre visit review using our clinic review tool, if applicable. No additional management support is needed unless otherwise documented below in the visit note.  Living will d/w pt.  Husband designated if patient were incapacitated.   Hyperglycemia/DM2. Weight increased during cancer tx/etc.  A1c 8.0.  Hadn't been working hard on diet or exercise.  Her job is getting in the way and her stress level is much higher.  She wants to avoid extra medicine.  D/w pt.  She is getting a bike to ride.    Meds, vitals, and allergies reviewed.   ROS: See HPI.  Otherwise, noncontributory.  GEN: nad, alert and oriented HEENT: mucous membranes moist NECK: supple w/o LA CV: rrr PULM: ctab, no inc wob ABD: soft, +bs EXT: no edema SKIN: no acute rash

## 2014-05-31 NOTE — Assessment & Plan Note (Signed)
Labs d/w pt.  She'll work on [employer] and diet in the meantime, recheck A1c in 3 months and then we'll go from there.  She agrees.

## 2014-08-24 ENCOUNTER — Telehealth: Payer: Self-pay | Admitting: *Deleted

## 2014-08-24 NOTE — Telephone Encounter (Signed)
Received a prior auth request fax from pharmacy for Benicar/HCTZ.

## 2014-08-24 NOTE — Telephone Encounter (Signed)
I called, and left a generic message on cell vm for pt to return call to office. Need ask pt if she has tried, and failed any other meds besides losartan.

## 2014-08-25 NOTE — Telephone Encounter (Signed)
Pt returned my call. The only other med she has taken was losartan, which she was intolerant to. PA forms placed in your inbox.

## 2014-08-26 NOTE — Telephone Encounter (Signed)
There was no return fax number on the PA form. Called and spoke to [place] at [place] and was advised to fax the completed form to [phone]. Cordie Grice stated that she will bring it to the attention of her manager that there is not a return fax number on the PA form. PA form faxed back.

## 2014-08-26 NOTE — Telephone Encounter (Signed)
Form done, please send in.  Thanks.  

## 2014-08-28 NOTE — Telephone Encounter (Signed)
Received an approval letter for Benicar/HCTZ. Med approved from 08/27/14-08/27/2016. Approval letter placed in your inbox.

## 2014-08-28 NOTE — Telephone Encounter (Signed)
Thanks, notify pt/pharmacy if not already done.

## 2014-08-31 NOTE — Telephone Encounter (Signed)
Approval letter faxed to pharmacy

## 2015-02-08 ENCOUNTER — Other Ambulatory Visit: Payer: Self-pay | Admitting: Family Medicine

## 2015-02-16 ENCOUNTER — Other Ambulatory Visit (INDEPENDENT_AMBULATORY_CARE_PROVIDER_SITE_OTHER): Payer: Self-pay

## 2015-02-16 DIAGNOSIS — E119 Type 2 diabetes mellitus without complications: Secondary | ICD-10-CM

## 2015-02-16 LAB — HEMOGLOBIN A1C: HEMOGLOBIN A1C: 8.6 % — AB (ref 4.6–6.5)

## 2015-02-18 ENCOUNTER — Ambulatory Visit (INDEPENDENT_AMBULATORY_CARE_PROVIDER_SITE_OTHER): Payer: 59 | Admitting: Family Medicine

## 2015-02-18 ENCOUNTER — Encounter: Payer: Self-pay | Admitting: Family Medicine

## 2015-02-18 VITALS — BP 138/82 | HR 92 | Temp 98.5°F | Wt 218.0 lb

## 2015-02-18 DIAGNOSIS — E119 Type 2 diabetes mellitus without complications: Secondary | ICD-10-CM | POA: Diagnosis not present

## 2015-02-18 NOTE — Patient Instructions (Signed)
Diet and exercise, use the handout.  Recheck labs in about 3 months before a physical.  Glad to see you.  Take care.

## 2015-02-18 NOTE — Progress Notes (Signed)
Pre visit review using our clinic review tool, if applicable. No additional management support is needed unless otherwise documented below in the visit note.  DM2.  She retied at the end of 2015.  [facility] "and I'm trying to get my life back in order."  Hasn't been checking sugars.  She wanted to avoid meds.  D/w pt.  She is going to work on exercise, diet discussed.  Handout given to patient, d/w pt. A1c up compared to prev, d/w pt.   PMH and SH reviewed  ROS: See HPI, otherwise noncontributory.  Meds, vitals, and allergies reviewed.   GEN: nad, alert and oriented HEENT: mucous membranes moist NECK: supple w/o LA CV: rrr PULM: ctab, no inc wob ABD: soft, +bs EXT: no edema  Diabetic foot exam: Normal inspection No skin breakdown No calluses  Normal DP pulses Normal sensation to light touch and monofilament Nails normal

## 2015-02-18 NOTE — Assessment & Plan Note (Signed)
She'll work hard on diet and exercise, handout given to patient.  Recheck in about 3 months at a physical.  She agrees.  >25 minutes spent in face to face time with patient, >50% spent in counselling or coordination of care.

## 2015-02-24 ENCOUNTER — Telehealth: Payer: Self-pay

## 2015-02-24 DIAGNOSIS — N6310 Unspecified lump in the right breast, unspecified quadrant: Secondary | ICD-10-CM

## 2015-02-24 NOTE — Telephone Encounter (Signed)
Pt left v/m requesting order for diagnostic mammogram sent to [facility] in [place]; pt already has appt for mammo on 03/15/2015.

## 2015-02-25 NOTE — Telephone Encounter (Signed)
Both ordered.  Routed to [facility] as [place].  Thanks.

## 2015-02-25 NOTE — Telephone Encounter (Signed)
Orders faxed to [facility] at [phone]. LVM on pt cell phone making her aware orders were faxed

## 2015-02-25 NOTE — Addendum Note (Signed)
Addended by: Tonia Ghent on: 02/25/2015 06:52 AM   Modules accepted: Orders

## 2015-03-17 ENCOUNTER — Encounter: Payer: Self-pay | Admitting: *Deleted

## 2015-03-17 ENCOUNTER — Ambulatory Visit (INDEPENDENT_AMBULATORY_CARE_PROVIDER_SITE_OTHER): Payer: 59 | Admitting: Internal Medicine

## 2015-03-17 ENCOUNTER — Encounter: Payer: Self-pay | Admitting: Internal Medicine

## 2015-03-17 ENCOUNTER — Encounter: Payer: Self-pay | Admitting: Family Medicine

## 2015-03-17 VITALS — BP 132/74 | HR 79 | Temp 98.4°F | Wt 212.0 lb

## 2015-03-17 DIAGNOSIS — J069 Acute upper respiratory infection, unspecified: Secondary | ICD-10-CM

## 2015-03-17 DIAGNOSIS — B9789 Other viral agents as the cause of diseases classified elsewhere: Principal | ICD-10-CM

## 2015-03-17 MED ORDER — HYDROCODONE-HOMATROPINE 5-1.5 MG/5ML PO SYRP: 5.0000 mL | ORAL_SOLUTION | Freq: Three times a day (TID) | ORAL | Status: DC | PRN

## 2015-03-17 NOTE — Patient Instructions (Signed)
Upper Respiratory Infection, Adult An upper respiratory infection (URI) is also sometimes known as the common cold. The upper respiratory tract includes the nose, sinuses, throat, trachea, and bronchi. Bronchi are the airways leading to the lungs. Most people improve within 1 week, but symptoms can last up to 2 weeks. A residual cough may last even longer.  CAUSES Many different viruses can infect the tissues lining the upper respiratory tract. The tissues become irritated and inflamed and often become very moist. Mucus production is also common. A cold is contagious. You can easily spread the virus to others by oral contact. This includes kissing, sharing a glass, coughing, or sneezing. Touching your mouth or nose and then touching a surface, which is then touched by another person, can also spread the virus. SYMPTOMS  Symptoms typically develop 1 to 3 days after you come in contact with a cold virus. Symptoms vary from person to person. They may include:  Runny nose.  Sneezing.  Nasal congestion.  Sinus irritation.  Sore throat.  Loss of voice (laryngitis).  Cough.  Fatigue.  Muscle aches.  Loss of appetite.  Headache.  Low-grade fever. DIAGNOSIS  You might diagnose your own cold based on familiar symptoms, since most people get a cold 2 to 3 times a year. Your caregiver can confirm this based on your exam. Most importantly, your caregiver can check that your symptoms are not due to another disease such as strep throat, sinusitis, pneumonia, asthma, or epiglottitis. Blood tests, throat tests, and X-rays are not necessary to diagnose a common cold, but they may sometimes be helpful in excluding other more serious diseases. Your caregiver will decide if any further tests are required. RISKS AND COMPLICATIONS  You may be at risk for a more severe case of the common cold if you smoke cigarettes, have chronic heart disease (such as heart failure) or lung disease (such as asthma), or if  you have a weakened immune system. The very young and very old are also at risk for more serious infections. Bacterial sinusitis, middle ear infections, and bacterial pneumonia can complicate the common cold. The common cold can worsen asthma and chronic obstructive pulmonary disease (COPD). Sometimes, these complications can require emergency medical care and may be life-threatening. PREVENTION  The best way to protect against getting a cold is to practice good hygiene. Avoid oral or hand contact with people with cold symptoms. Wash your hands often if contact occurs. There is no clear evidence that vitamin C, vitamin E, echinacea, or exercise reduces the chance of developing a cold. However, it is always recommended to get plenty of rest and practice good nutrition. TREATMENT  Treatment is directed at relieving symptoms. There is no cure. Antibiotics are not effective, because the infection is caused by a virus, not by bacteria. Treatment may include:  Increased fluid intake. Sports drinks offer valuable electrolytes, sugars, and fluids.  Breathing heated mist or steam (vaporizer or shower).  Eating chicken soup or other clear broths, and maintaining good nutrition.  Getting plenty of rest.  Using gargles or lozenges for comfort.  Controlling fevers with ibuprofen or acetaminophen as directed by your caregiver.  Increasing usage of your inhaler if you have asthma. Zinc gel and zinc lozenges, taken in the first 24 hours of the common cold, can shorten the duration and lessen the severity of symptoms. Pain medicines may help with fever, muscle aches, and throat pain. A variety of non-prescription medicines are available to treat congestion and runny nose. Your caregiver   can make recommendations and may suggest nasal or lung inhalers for other symptoms.  HOME CARE INSTRUCTIONS   Only take over-the-counter or prescription medicines for pain, discomfort, or fever as directed by your  caregiver.  Use a warm mist humidifier or inhale steam from a shower to increase air moisture. This may keep secretions moist and make it easier to breathe.  Drink enough water and fluids to keep your urine clear or pale yellow.  Rest as needed.  Return to work when your temperature has returned to normal or as your caregiver advises. You may need to stay home longer to avoid infecting others. You can also use a face mask and careful hand washing to prevent spread of the virus. SEEK MEDICAL CARE IF:   After the first few days, you feel you are getting worse rather than better.  You need your caregiver's advice about medicines to control symptoms.  You develop chills, worsening shortness of breath, or brown or red sputum. These may be signs of pneumonia.  You develop yellow or brown nasal discharge or pain in the face, especially when you bend forward. These may be signs of sinusitis.  You develop a fever, swollen neck glands, pain with swallowing, or white areas in the back of your throat. These may be signs of strep throat. SEEK IMMEDIATE MEDICAL CARE IF:   You have a fever.  You develop severe or persistent headache, ear pain, sinus pain, or chest pain.  You develop wheezing, a prolonged cough, cough up blood, or have a change in your usual mucus (if you have chronic lung disease).  You develop sore muscles or a stiff neck. Document Released: 04/25/2001 Document Revised: 01/22/2012 Document Reviewed: 02/04/2014 ExitCare Patient Information 2015 [employer]. This information is not intended to replace advice given to you by your health care provider. Make sure you discuss any questions you have with your health care provider.  

## 2015-03-17 NOTE — Progress Notes (Signed)
Pre visit review using our clinic review tool, if applicable. No additional management support is needed unless otherwise documented below in the visit note. 

## 2015-03-17 NOTE — Progress Notes (Signed)
HPI  Pt presents to the clinic today with c/o cough and nasal congestion x 4 days. She has ongoing seasonal allergies which she treats with Claritin and Flonase daily. Her granddaughter was recently sick with a cold and her husband has similar symptoms. The cough is non-productive but constant, keeping her awake at night. She has tried using Mucinex and Nyquil with only minimal relief. She denies fever, chills, body aches, SOB. She did have a flu shot last year, never had pneumonia vaccine.    Review of Systems      Past Medical History  Diagnosis Date  . GERD (gastroesophageal reflux disease)     Lack of relief with Prilosec and other OTC PPI's  . Elevated liver function tests     Mild increase due to fatty liver seen on [place], normal after weight loss  . Carpal tunnel syndrome   . Hyperlipidemia   . Hypertension     Benign  . Arthritis   . Cancer 2011 - Released by Dr. Jana Hakim 2012    stage 0 right breast S/P lumpectomy and sentinel node biopsy and radiation - per Dr. Donne Hazel.  . Diabetes mellitus without complication     Family History  Problem Relation Age of Onset  . Cancer Father     Lung  . Colon cancer Neg Hx     History   Social History  . Marital Status: Married    Spouse Name: N/A  . Number of Children: 2  . Years of Education: N/A   Occupational History  . Full time    Social History Main Topics  . Smoking status: Never Smoker   . Smokeless tobacco: Never Used  . Alcohol Use: No  . Drug Use: No  . Sexual Activity: Not on file   Other Topics Concern  . Not on file   Social History Narrative   [facility].   Reads for pleasure.   2 kids, 2 grandkids   Retired 2015    Allergies  Allergen Reactions  . Benadryl [Diphenhydramine Hcl]     Insomnia and headache  . Losartan     Presumed cause of itching, tolerated benicar     Constitutional: Positive headache. Denies fatigue, fever or abrupt weight changes.  HEENT:   Positive runny nose. Denies eye redness, eye pain, pressure behind the eyes, facial pain, nasal congestion, ear pain, ringing in the ears, wax buildup, runny nose or sore throat. Respiratory: Positive cough. Denies difficulty breathing or shortness of breath.  Cardiovascular: Denies chest pain, chest tightness, palpitations or swelling in the hands or feet.   No other specific complaints in a complete review of systems (except as listed in HPI above).  Objective:   BP 132/74 mmHg  Pulse 79  Temp(Src) 98.4 F (36.9 C) (Oral)  Wt 212 lb (96.163 kg)  SpO2 98% Wt Readings from Last 3 Encounters:  03/17/15 212 lb (96.163 kg)  02/18/15 218 lb (98.884 kg)  05/29/14 224 lb (101.606 kg)     General: Appears her stated age, in NAD. HEENT: Head: normal shape and size, no sinus tenderness noted; Eyes: sclera white, no icterus, conjunctiva pink; Ears: Tm's gray and intact, normal light reflex, +effusion on the left; Nose: swollen, boggy turbinates; mucosa pink and moist, septum midline; Throat/Mouth: + PND. Teeth present, mucosa erythematous and moist, no exudate noted, no lesions or ulcerations noted.  Neck: No cervical lymphadenopathy.  Cardiovascular: Normal rate and rhythm. S1,S2 noted.  No murmur, rubs or gallops noted. . Pulmonary/Chest: Normal effort and positive vesicular breath sounds. No respiratory distress. No wheezes, rales or ronchi noted.      Assessment & Plan:   Viral Upper Respiratory Infection with Cough:  Get some rest and drink plenty of water May be an allergy component, switch Claritin to Allegra, continue [employer] Rx for Hycodan cough syrup Watch for fever, chills, colored mucous or shortness of breath  RTC as needed or if symptoms persist.

## 2015-05-20 ENCOUNTER — Other Ambulatory Visit: Payer: Self-pay | Admitting: Family Medicine

## 2015-05-28 ENCOUNTER — Encounter: Payer: 59 | Admitting: Family Medicine

## 2015-07-03 LAB — HM DIABETES EYE EXAM

## 2015-07-26 ENCOUNTER — Other Ambulatory Visit (INDEPENDENT_AMBULATORY_CARE_PROVIDER_SITE_OTHER): Payer: 59

## 2015-07-26 DIAGNOSIS — E119 Type 2 diabetes mellitus without complications: Secondary | ICD-10-CM | POA: Diagnosis not present

## 2015-07-26 LAB — LIPID PANEL
CHOLESTEROL: 194 mg/dL (ref 0–200)
HDL: 47.5 mg/dL (ref >39.00)
LDL Cholesterol: 117 mg/dL — ABNORMAL HIGH (ref 0–99)
NonHDL: 146.39
TRIGLYCERIDES: 146 mg/dL (ref 0.0–149.0)
Total CHOL/HDL Ratio: 4
VLDL: 29.2 mg/dL (ref 0.0–40.0)

## 2015-07-26 LAB — COMPREHENSIVE METABOLIC PANEL
ALBUMIN: 4 g/dL (ref 3.5–5.2)
ALK PHOS: 53 U/L (ref 39–117)
ALT: 35 U/L (ref 0–35)
AST: 29 U/L (ref 0–37)
BILIRUBIN TOTAL: 0.6 mg/dL (ref 0.2–1.2)
BUN: 8 mg/dL (ref 6–23)
CALCIUM: 9.7 mg/dL (ref 8.4–10.5)
CO2: 34 mEq/L — ABNORMAL HIGH (ref 19–32)
Chloride: 99 mEq/L (ref 96–112)
Creatinine, Ser: 0.82 mg/dL (ref 0.40–1.20)
GFR: 74.78 mL/min (ref >60.00)
Glucose, Bld: 150 mg/dL — ABNORMAL HIGH (ref 70–99)
Potassium: 4.2 mEq/L (ref 3.5–5.1)
Sodium: 140 mEq/L (ref 135–145)
TOTAL PROTEIN: 7.1 g/dL (ref 6.0–8.3)

## 2015-07-26 LAB — HEMOGLOBIN A1C: Hgb A1c MFr Bld: 7.5 % — ABNORMAL HIGH (ref 4.6–6.5)

## 2015-07-29 ENCOUNTER — Encounter: Payer: 59 | Admitting: Family Medicine

## 2015-08-02 ENCOUNTER — Encounter: Payer: Self-pay | Admitting: Family Medicine

## 2015-08-02 ENCOUNTER — Ambulatory Visit (INDEPENDENT_AMBULATORY_CARE_PROVIDER_SITE_OTHER): Payer: 59 | Admitting: Family Medicine

## 2015-08-02 VITALS — BP 128/70 | HR 76 | Temp 98.8°F | Ht 61.0 in | Wt 212.2 lb

## 2015-08-02 DIAGNOSIS — I10 Essential (primary) hypertension: Secondary | ICD-10-CM | POA: Diagnosis not present

## 2015-08-02 DIAGNOSIS — E119 Type 2 diabetes mellitus without complications: Secondary | ICD-10-CM | POA: Diagnosis not present

## 2015-08-02 DIAGNOSIS — Z23 Encounter for immunization: Secondary | ICD-10-CM | POA: Diagnosis not present

## 2015-08-02 DIAGNOSIS — Z0000 Encounter for general adult medical examination without abnormal findings: Secondary | ICD-10-CM | POA: Diagnosis not present

## 2015-08-02 DIAGNOSIS — Z119 Encounter for screening for infectious and parasitic diseases, unspecified: Secondary | ICD-10-CM

## 2015-08-02 DIAGNOSIS — Z853 Personal history of malignant neoplasm of breast: Secondary | ICD-10-CM

## 2015-08-02 MED ORDER — FLUTICASONE PROPIONATE 50 MCG/ACT NA SUSP: NASAL | Status: DC

## 2015-08-02 NOTE — Progress Notes (Signed)
Pre visit review using our clinic review tool, if applicable. No additional management support is needed unless otherwise documented below in the visit note.  CPE- See plan.  Routine anticipatory guidance given to patient.  See health maintenance. Tetanus 2014 Flu to be done at the pharmacy.   Shingles 2014 PNA shot d/w pt.  Due.   Colonoscopy 2012  Pap per gyn Mammogram 2016 DXA 08/13/13- wnl Diet and exercise d/w pt. Exercise had been episodically limited by L shoulder pain. She had follow up with ortho about that. She had gone through PT prev. She was prev injected.  If consistently worse, then she'll call ortho.   Living will d/w pt.Husband designated if patient were incapacitated. Pt opts in for HCV screening with next set of labs.  D/w pt re: routine screening.   HIV prev neg in ~1995 per patient report.    Diabetes:  No meds.   Hypoglycemic episodes: no sx Hyperglycemic episodes: no sx Feet problems: no Blood Sugars averaging: not checked.  eye exam within last year: yes, 1 month ago.   A1c not at goal but improved from prev.   She wanted to go to nutrition for eval.  D/w pt.    Hypertension:    Using medication without problems or lightheadedness: yes Chest pain with exertion:no Edema:no Short of breath:no  She has been released by oncology clinic for her hx of breast cancer.    PMH and SH reviewed  Meds, vitals, and allergies reviewed.   ROS: See HPI.  Otherwise negative.    GEN: nad, alert and oriented HEENT: mucous membranes moist NECK: supple w/o LA CV: rrr. PULM: ctab, no inc wob ABD: soft, +bs EXT: no edema SKIN: no acute rash  Diabetic foot exam: Normal inspection No skin breakdown No calluses  Normal DP pulses Normal sensation to light touch and monofilament Nails normal

## 2015-08-02 NOTE — Patient Instructions (Addendum)
Rosaria Ferries will call about your referral. Check to see if the nutrition consult is covered by insurance.  Recheck A1c in about 6 months before a visit to follow up for diabetes.  Keep working on [employer] and exercise.   Take care.  Glad to see you.  I would get a flu shot each fall.

## 2015-08-03 NOTE — Assessment & Plan Note (Signed)
Continue diet and exercise, labs d/w pt.  Refer to DM education.  Recheck labs in about 6 months.

## 2015-08-03 NOTE — Assessment & Plan Note (Signed)
Controlled, continue as is.  She agrees.  Labs d/w pt.

## 2015-08-03 NOTE — Assessment & Plan Note (Signed)
Up to date on mammogram, etc.  Released by onc clinic.  Doing well.

## 2015-08-03 NOTE — Assessment & Plan Note (Signed)
Routine anticipatory guidance given to patient. See health maintenance.  Tetanus 2014  Flu to be done at the pharmacy.  Shingles 2014  PNA shot d/w pt. Due.  Colonoscopy 2012  Pap per gyn  Mammogram 2016  DXA 08/13/13- wnl  Diet and exercise d/w pt. Exercise had been episodically limited by L shoulder pain. She had follow up with ortho about that. She had gone through PT prev. She was prev injected. If consistently worse, then she'll call ortho.  Living will d/w pt. Husband designated if patient were incapacitated.  Pt opts in for HCV screening with next set of labs. D/w pt re: routine screening.  HIV prev neg in ~1995 per patient report.

## 2015-08-09 ENCOUNTER — Other Ambulatory Visit: Payer: Self-pay | Admitting: Family Medicine

## 2015-08-10 MED ORDER — FLUTICASONE PROPIONATE 50 MCG/ACT NA SUSP: NASAL | Status: DC

## 2015-08-10 NOTE — Telephone Encounter (Signed)
Received fax request from [facility] to change to 90 days supply on   fluticasone (FLONASE) 50 MCG/ACT nasal spray

## 2015-11-29 ENCOUNTER — Encounter: Payer: Self-pay | Admitting: Family Medicine

## 2015-11-29 ENCOUNTER — Ambulatory Visit (INDEPENDENT_AMBULATORY_CARE_PROVIDER_SITE_OTHER): Payer: 59 | Admitting: Family Medicine

## 2015-11-29 VITALS — BP 144/80 | HR 88 | Temp 97.9°F | Wt 217.0 lb

## 2015-11-29 DIAGNOSIS — J209 Acute bronchitis, unspecified: Secondary | ICD-10-CM | POA: Diagnosis not present

## 2015-11-29 MED ORDER — AZITHROMYCIN 250 MG PO TABS: ORAL_TABLET | ORAL | Status: DC

## 2015-11-29 MED ORDER — HYDROCOD POLST-CPM POLST ER 10-8 MG/5ML PO SUER: 5.0000 mL | Freq: Every evening | ORAL | Status: DC | PRN

## 2015-11-29 NOTE — Progress Notes (Signed)
Pre visit review using our clinic review tool, if applicable. No additional management support is needed unless otherwise documented below in the visit note. 

## 2015-11-29 NOTE — Progress Notes (Addendum)
BP 144/80 mmHg  Pulse 88  Temp(Src) 97.9 F (36.6 C) (Oral)  Wt 217 lb (98.431 kg)  SpO2 98%   CC: persistent cough/cold  Subjective:    Patient ID: Anita Cooper, female    DOB: January 06, 1951, 65 y.o.   MRN: [id number]  HPI: PHILLIP MCDUFF is a 66 y.o. female presenting on 11/29/2015 for URI   1+ month history of URI with cough. URI sxs resolved, cough persists. Feels chest congestion, some dyspnea with this as well. Mostly dry nagging cough, but occasionally rattling.   Tried tessalon perls which help during the day. Taking hycodan at night time - but this keeps her awake. Taking mucinex to no avail. Flonase has led to some nosebleeds.   Husband with sinus infection recently. Now resolved. No smokers at home.  No h/o asthma. + h/o breast cancer.   Relevant past medical, surgical, family and social history reviewed and updated as indicated. Interim medical history since our last visit reviewed. Allergies and medications reviewed and updated. Current Outpatient Prescriptions on File Prior to Visit  Medication Sig  . aspirin 81 MG tablet Take 81 mg by mouth daily.    Marland Kitchen BENICAR HCT 40-25 MG per tablet TAKE 1/2 TABLET BY MOUTH EVERY DAY  . esomeprazole (NEXIUM) 20 MG capsule Take 2 capsules (40 mg total) by mouth daily before breakfast.  . fexofenadine (ALLEGRA) 180 MG tablet Take 180 mg by mouth daily.  . fluticasone (FLONASE) 50 MCG/ACT nasal spray USE 1 SPRAY PER NOSTRIL ONCE A DAY  . ibuprofen (ADVIL) 200 MG tablet Take 400 mg by mouth every 6 (six) hours as needed.    . Multiple Vitamin (MULTIVITAMIN) tablet Take 1 tablet by mouth daily.     No current facility-administered medications on file prior to visit.    Review of Systems Per HPI unless specifically indicated in ROS section     Objective:    BP 144/80 mmHg  Pulse 88  Temp(Src) 97.9 F (36.6 C) (Oral)  Wt 217 lb (98.431 kg)  SpO2 98%  Wt Readings from Last 3 Encounters:  11/29/15 217 lb (98.431 kg)    08/02/15 212 lb 4 oz (96.276 kg)  03/17/15 212 lb (96.163 kg)    Physical Exam  Constitutional: She appears well-developed and well-nourished. No distress.  HENT:  Head: Normocephalic and atraumatic.  Right Ear: Hearing, tympanic membrane, external ear and ear canal normal.  Left Ear: Hearing, tympanic membrane, external ear and ear canal normal.  Nose: Mucosal edema (irritation) present. No rhinorrhea. Right sinus exhibits no maxillary sinus tenderness and no frontal sinus tenderness. Left sinus exhibits no maxillary sinus tenderness and no frontal sinus tenderness.  Mouth/Throat: Uvula is midline and mucous membranes are normal. Posterior oropharyngeal erythema (mild) present. No oropharyngeal exudate, posterior oropharyngeal edema or tonsillar abscesses.  White post oropharyngeal drainage  Eyes: Conjunctivae and EOM are normal. Pupils are equal, round, and reactive to light. No scleral icterus.  Neck: Normal range of motion. Neck supple.  Cardiovascular: Normal rate, regular rhythm, normal heart sounds and intact distal pulses.   No murmur heard. Pulmonary/Chest: Effort normal and breath sounds normal. No respiratory distress. She has no wheezes. She has no rales.  Lymphadenopathy:    She has no cervical adenopathy.  Skin: Skin is warm and dry. No rash noted.  Nursing note and vitals reviewed.  Results for orders placed or performed in visit on 08/02/15  HM DIABETES EYE EXAM  Result Value Ref Range   HM  Diabetic Eye Exam No Retinopathy No Retinopathy   Lab Results  Component Value Date   HGBA1C 7.5* 07/26/2015       Assessment & Plan:   Problem List Items Addressed This Visit    Acute bronchitis - Primary    Given duration and progression of sxs, treat with zpack. Hycodan and tessalon ineffective, pt declines codeine. Will trial tussionex pennkinetic.  Update if not improving with treatment plan.          Follow up plan: No Follow-up on file.

## 2015-11-29 NOTE — Addendum Note (Signed)
Addended by: Ria Bush on: 11/29/2015 05:08 PM   Modules accepted: Miquel Dunn

## 2015-11-29 NOTE — Patient Instructions (Addendum)
You have bronchitis. Treat with zpack and tussionex for cough at night time. Push fluids and plenty of rest. Please return if you are not improving as expected, or if you have high fevers (>101.5) or difficulty swallowing or worsening productive cough. Call clinic with questions.  Good to see you today. I hope you start feeling better soon.

## 2015-11-29 NOTE — Assessment & Plan Note (Signed)
Given duration and progression of sxs, treat with zpack. Hycodan and tessalon ineffective, pt declines codeine. Will trial tussionex pennkinetic.  Update if not improving with treatment plan.

## 2016-01-05 ENCOUNTER — Other Ambulatory Visit: Payer: 59

## 2016-01-10 ENCOUNTER — Ambulatory Visit: Payer: 59 | Admitting: Family Medicine

## 2016-02-14 ENCOUNTER — Other Ambulatory Visit: Payer: Self-pay | Admitting: *Deleted

## 2016-02-14 MED ORDER — FLUTICASONE PROPIONATE 50 MCG/ACT NA SUSP: NASAL | Status: DC

## 2016-02-15 ENCOUNTER — Other Ambulatory Visit: Payer: Self-pay | Admitting: *Deleted

## 2016-02-15 MED ORDER — FLUTICASONE PROPIONATE 50 MCG/ACT NA SUSP: NASAL | Status: DC

## 2016-02-16 ENCOUNTER — Other Ambulatory Visit (INDEPENDENT_AMBULATORY_CARE_PROVIDER_SITE_OTHER): Payer: 59

## 2016-02-16 ENCOUNTER — Other Ambulatory Visit: Payer: Self-pay | Admitting: Family Medicine

## 2016-02-16 DIAGNOSIS — E119 Type 2 diabetes mellitus without complications: Secondary | ICD-10-CM | POA: Diagnosis not present

## 2016-02-16 DIAGNOSIS — Z119 Encounter for screening for infectious and parasitic diseases, unspecified: Secondary | ICD-10-CM

## 2016-02-16 LAB — HEPATITIS C ANTIBODY: HCV AB: NEGATIVE

## 2016-02-16 LAB — HEMOGLOBIN A1C: HEMOGLOBIN A1C: 8.6 % — AB (ref 4.6–6.5)

## 2016-02-23 ENCOUNTER — Ambulatory Visit (INDEPENDENT_AMBULATORY_CARE_PROVIDER_SITE_OTHER): Payer: 59 | Admitting: Family Medicine

## 2016-02-23 ENCOUNTER — Encounter: Payer: Self-pay | Admitting: Family Medicine

## 2016-02-23 VITALS — BP 138/78 | HR 92 | Temp 98.3°F | Ht 61.0 in | Wt 214.8 lb

## 2016-02-23 DIAGNOSIS — E119 Type 2 diabetes mellitus without complications: Secondary | ICD-10-CM | POA: Diagnosis not present

## 2016-02-23 MED ORDER — OLMESARTAN MEDOXOMIL-HCTZ 40-25 MG PO TABS
0.5000 | ORAL_TABLET | Freq: Every day | ORAL | Status: DC
Start: 2016-02-23 — End: 2017-02-20

## 2016-02-23 NOTE — Patient Instructions (Signed)
Work on diet and exercise in the meantime.  Recheck A1c in about 3 months.  If better, then no office visit this summer and we'll meet at a physical in the fall.  Take care.  Glad to see you.

## 2016-02-23 NOTE — Assessment & Plan Note (Signed)
She is at the point where she can make a change and stick with it.  She has gotten her A1c down prev with diet and exercise.  She wants to avoid more meds. Recheck A1c in about 3 months.  We'll go from there.  She agrees.

## 2016-02-23 NOTE — Progress Notes (Signed)
Pre visit review using our clinic review tool, if applicable. No additional management support is needed unless otherwise documented below in the visit note.  Diabetes:  No meds Hypoglycemic episodes: no sx Hyperglycemic episodes: no sx Feet problems: no Blood Sugars averaging: not checked eye exam within last year: done 06/2015 a1c up, d/w pt.  Diet and exercise d/w pt.  She is going to work more on both in the meantime.  She has been caring for her mother in the meantime and that has affected her schedule.   She is at the point where she can make a change and stick with it.  She has gotten her A1c down prev with diet and exercise.   She wants to avoid more meds.  Meds, vitals, and allergies reviewed.   ROS: See HPI.  Otherwise negative.    GEN: nad, alert and oriented HEENT: mucous membranes moist, tm wnl B, nasal exam stuffy but w/o erythema.  OP wnl o/w.  NECK: supple w/o LA CV: rrr. PULM: ctab, no inc wob EXT: no edema  Diabetic foot exam: Normal inspection No skin breakdown No calluses  Normal DP pulses Normal sensation to light touch and monofilament Nails normal

## 2016-03-02 ENCOUNTER — Ambulatory Visit: Payer: 59 | Admitting: Family Medicine

## 2016-04-05 ENCOUNTER — Encounter: Payer: Self-pay | Admitting: Family Medicine

## 2017-02-06 ENCOUNTER — Encounter: Payer: Self-pay | Admitting: Family

## 2017-02-06 ENCOUNTER — Ambulatory Visit (INDEPENDENT_AMBULATORY_CARE_PROVIDER_SITE_OTHER): Payer: BLUE CROSS/BLUE SHIELD | Admitting: Family

## 2017-02-06 VITALS — BP 160/85 | HR 92 | Temp 98.4°F | Resp 16 | Ht 61.0 in | Wt 215.2 lb

## 2017-02-06 DIAGNOSIS — J0110 Acute frontal sinusitis, unspecified: Secondary | ICD-10-CM

## 2017-02-06 MED ORDER — AMOXICILLIN 500 MG PO CAPS: 500.0000 mg | ORAL_CAPSULE | Freq: Two times a day (BID) | ORAL | 0 refills | Status: DC

## 2017-02-06 NOTE — Progress Notes (Signed)
Subjective:    Patient ID: Anita Cooper, female    DOB: 1951-03-23, 66 y.o.   MRN: [id number]  CC: Anita Cooper is a 66 y.o. female who presents today for an acute visit.    HPI: CC: congestion x 2 weeks, some improved. Feels post nasal drip and 'little' cough. No wheezing, sob, fever, chills, CP. Tried mucinex DM with no relief.    HTN- compliant with medicaitons. Not normally high, thinks because of lack of sleep. Denies exertional chest pain or pressure, numbness or tingling radiating to left arm or jaw, palpitations, dizziness, frequent headaches, changes in vision, or shortness of breath.        HISTORY:  Past Medical History:  Diagnosis Date  . Arthritis   . Cancer [facility]) 2011 - Released by Dr. Jana Hakim 2012   stage 0 right breast S/P lumpectomy and sentinel node biopsy and radiation - per Dr. Donne Hazel.  . Carpal tunnel syndrome   . Diabetes mellitus without complication ([place])   . Elevated liver function tests    Mild increase due to fatty liver seen on [place], normal after weight loss  . GERD (gastroesophageal reflux disease)    Lack of relief with Prilosec and other OTC PPI's  . Hyperlipidemia   . Hypertension    Benign   Past Surgical History:  Procedure Laterality Date  . BREAST LUMPECTOMY  2011   right lumpectomy, sentinel node biopsy  . KNEE ARTHROSCOPY     Bilaterally   Family History  Problem Relation Age of Onset  . Cancer Father     Lung  . Colon cancer Neg Hx     Allergies: Benadryl [diphenhydramine hcl] and Losartan Current Outpatient Prescriptions on File Prior to Visit  Medication Sig Dispense Refill  . aspirin 81 MG tablet Take 81 mg by mouth daily.      Marland Kitchen esomeprazole (NEXIUM) 20 MG capsule Take 2 capsules (40 mg total) by mouth daily before breakfast. 180 capsule 3  . fexofenadine (ALLEGRA) 180 MG tablet Take 180 mg by mouth daily.    . fluticasone (FLONASE) 50 MCG/ACT nasal spray USE 1 SPRAY PER NOSTRIL ONCE A DAY 48 g 1  . ibuprofen  (ADVIL) 200 MG tablet Take 400 mg by mouth every 6 (six) hours as needed.      . Multiple Vitamin (MULTIVITAMIN) tablet Take 1 tablet by mouth daily.      Marland Kitchen olmesartan-hydrochlorothiazide (BENICAR HCT) 40-25 MG tablet Take 0.5 tablets by mouth daily. 45 tablet 3   No current facility-administered medications on file prior to visit.     Social History  Substance Use Topics  . Smoking status: Never Smoker  . Smokeless tobacco: Never Used  . Alcohol use No    Review of Systems  Constitutional: Negative for chills and fever.  HENT: Positive for congestion and postnasal drip. Negative for ear pain, sinus pain and sore throat.   Eyes: Negative for visual disturbance.  Respiratory: Positive for cough. Negative for shortness of breath and wheezing.   Cardiovascular: Negative for chest pain and palpitations.  Gastrointestinal: Negative for nausea and vomiting.  Neurological: Negative for headaches.      Objective:    BP (!) 160/85   Pulse 92   Temp 98.4 F (36.9 C) (Oral)   Resp 16   Ht 5\' 1"  (1.549 m)   Wt 215 lb 3.2 oz (97.6 kg)   SpO2 97%   BMI 40.66 kg/m  BP Readings from Last 3 Encounters:  02/06/17 Marland Kitchen)  160/85  02/23/16 138/78  11/29/15 (!) 144/80     Physical Exam  Constitutional: She appears well-developed and well-nourished.  HENT:  Head: Normocephalic and atraumatic.  Right Ear: Hearing, tympanic membrane, external ear and ear canal normal. No drainage, swelling or tenderness. No foreign bodies. Tympanic membrane is not erythematous and not bulging. No middle ear effusion. No decreased hearing is noted.  Left Ear: Hearing, tympanic membrane, external ear and ear canal normal. No drainage, swelling or tenderness. No foreign bodies. Tympanic membrane is not erythematous and not bulging.  No middle ear effusion. No decreased hearing is noted.  Nose: Nose normal. No rhinorrhea. Right sinus exhibits no maxillary sinus tenderness and no frontal sinus tenderness. Left sinus  exhibits no maxillary sinus tenderness and no frontal sinus tenderness.  Mouth/Throat: Uvula is midline, oropharynx is clear and moist and mucous membranes are normal. No oropharyngeal exudate, posterior oropharyngeal edema, posterior oropharyngeal erythema or tonsillar abscesses.  Eyes: Conjunctivae are normal.  Cardiovascular: Regular rhythm, normal heart sounds and normal pulses.   Pulmonary/Chest: Effort normal and breath sounds normal. She has no wheezes. She has no rhonchi. She has no rales.  Lymphadenopathy:       Head (right side): No submental, no submandibular, no tonsillar, no preauricular, no posterior auricular and no occipital adenopathy present.       Head (left side): No submental, no submandibular, no tonsillar, no preauricular, no posterior auricular and no occipital adenopathy present.    She has no cervical adenopathy.  Neurological: She is alert.  Skin: Skin is warm and dry.  Psychiatric: She has a normal mood and affect. Her speech is normal and behavior is normal. Thought content normal.  Vitals reviewed.      Assessment & Plan:  1. Acute non-recurrent frontal sinusitis Duration 2 weeks, waxing and waning. Afebrile. Patient and I jointly decided based on duration of symptoms, we'll go ahead and start antibiotic. Return precautions given. Note: Blood pressure elevated today which is abnormal for patient. She's not experciencing any chest pain, shortness of breath. We jointly agreed that we would not increase blood pressure medication and she will give [place] a call today and tomorrow and let us know blood pressure readings from home.    I am having Ms. Packman start on amoxicillin. I am also having her maintain her aspirin, multivitamin, ibuprofen, esomeprazole, fexofenadine, fluticasone, and olmesartan-hydrochlorothiazide.   Meds ordered this encounter  Medications  . amoxicillin (AMOXIL) 500 MG capsule    Sig: Take 1 capsule (500 mg total) by mouth 2 (two) times  daily.    Dispense:  14 capsule    Refill:  0    Order Specific Question:   Supervising Provider    Answer:   Crecencio Mc [[id number]]    Return precautions given.   Risks, benefits, and alternatives of the medications and treatment plan prescribed today were discussed, and patient expressed understanding.   Education regarding symptom management and diagnosis given to patient on AVS.  Continue to follow with [name], MD for routine health maintenance.   Veda P Brailsford and I agreed with plan.   Mable Paris, FNP

## 2017-02-06 NOTE — Progress Notes (Signed)
Pre visit review using our clinic review tool, if applicable. No additional management support is needed unless otherwise documented below in the visit note. 

## 2017-02-06 NOTE — Patient Instructions (Signed)
Hold Advil as this may be contributing to elevation of blood pressure. As we discussed, likely suspected just being sick is elevated blood pressure. Please keep blood pressure log and notify us if blood pressure consistently goes over 140/90.  Otherwise, please start amoxicillin and let us know if you're not better.  May stay on plain mucinex with plenty of water

## 2017-02-07 ENCOUNTER — Telehealth: Payer: Self-pay | Admitting: Family Medicine

## 2017-02-07 DIAGNOSIS — E119 Type 2 diabetes mellitus without complications: Secondary | ICD-10-CM

## 2017-02-07 NOTE — Telephone Encounter (Signed)
Pt has cpe on 04/10, can you put in lab order, so she can go to [facility] on 04/02? Thanks

## 2017-02-08 NOTE — Telephone Encounter (Signed)
Ordered. Thanks

## 2017-02-08 NOTE — Telephone Encounter (Signed)
Left message on voicemail for patient to call back. 

## 2017-02-12 ENCOUNTER — Encounter (INDEPENDENT_AMBULATORY_CARE_PROVIDER_SITE_OTHER): Payer: BLUE CROSS/BLUE SHIELD

## 2017-02-12 DIAGNOSIS — E119 Type 2 diabetes mellitus without complications: Secondary | ICD-10-CM | POA: Diagnosis not present

## 2017-02-12 LAB — LIPID PANEL
CHOLESTEROL: 169 mg/dL (ref 0–200)
HDL: 39.5 mg/dL (ref >39.00)
LDL CALC: 93 mg/dL (ref 0–99)
NonHDL: 129.73
TRIGLYCERIDES: 182 mg/dL — AB (ref 0.0–149.0)
Total CHOL/HDL Ratio: 4
VLDL: 36.4 mg/dL (ref 0.0–40.0)

## 2017-02-12 LAB — COMPREHENSIVE METABOLIC PANEL
ALT: 50 U/L — ABNORMAL HIGH (ref 0–35)
AST: 35 U/L (ref 0–37)
Albumin: 4 g/dL (ref 3.5–5.2)
Alkaline Phosphatase: 57 U/L (ref 39–117)
BUN: 15 mg/dL (ref 6–23)
CALCIUM: 9.6 mg/dL (ref 8.4–10.5)
CHLORIDE: 96 meq/L (ref 96–112)
CO2: 33 mEq/L — ABNORMAL HIGH (ref 19–32)
Creatinine, Ser: 0.92 mg/dL (ref 0.40–1.20)
GFR: 65.16 mL/min (ref >60.00)
Glucose, Bld: 250 mg/dL — ABNORMAL HIGH (ref 70–99)
POTASSIUM: 3.7 meq/L (ref 3.5–5.1)
Sodium: 136 mEq/L (ref 135–145)
Total Bilirubin: 0.7 mg/dL (ref 0.2–1.2)
Total Protein: 7.2 g/dL (ref 6.0–8.3)

## 2017-02-12 LAB — HEMOGLOBIN A1C: Hgb A1c MFr Bld: 9.1 % — ABNORMAL HIGH (ref 4.6–6.5)

## 2017-02-12 NOTE — Telephone Encounter (Signed)
Patient notified by telephone that order is in per Dr. Damita Dunnings.

## 2017-02-20 ENCOUNTER — Encounter: Payer: Self-pay | Admitting: Family Medicine

## 2017-02-20 ENCOUNTER — Ambulatory Visit (INDEPENDENT_AMBULATORY_CARE_PROVIDER_SITE_OTHER): Payer: BLUE CROSS/BLUE SHIELD | Admitting: Family Medicine

## 2017-02-20 VITALS — BP 142/76 | HR 87 | Temp 97.9°F | Ht 61.0 in | Wt 214.0 lb

## 2017-02-20 DIAGNOSIS — Z853 Personal history of malignant neoplasm of breast: Secondary | ICD-10-CM

## 2017-02-20 DIAGNOSIS — E7800 Pure hypercholesterolemia, unspecified: Secondary | ICD-10-CM

## 2017-02-20 DIAGNOSIS — E119 Type 2 diabetes mellitus without complications: Secondary | ICD-10-CM

## 2017-02-20 DIAGNOSIS — Z0000 Encounter for general adult medical examination without abnormal findings: Secondary | ICD-10-CM

## 2017-02-20 DIAGNOSIS — K219 Gastro-esophageal reflux disease without esophagitis: Secondary | ICD-10-CM

## 2017-02-20 DIAGNOSIS — I10 Essential (primary) hypertension: Secondary | ICD-10-CM

## 2017-02-20 MED ORDER — FLUTICASONE PROPIONATE 50 MCG/ACT NA SUSP: NASAL | 3 refills | Status: DC

## 2017-02-20 MED ORDER — OLMESARTAN MEDOXOMIL-HCTZ 40-25 MG PO TABS: 0.5000 | ORAL_TABLET | Freq: Every day | ORAL | 3 refills | Status: DC

## 2017-02-20 NOTE — Progress Notes (Signed)
CPE- See plan.  Routine anticipatory guidance given to patient.  See health maintenance.  The possibility exists that previously documented standard health maintenance information may have been brought forward from a previous encounter into this note.  If needed, that same information has been updated to reflect the current situation based on today's encounter.    Tetanus 2014  Flu to be done at the pharmacy.  Shingles 2014  PNA shot 2016.   Colonoscopy 2012  Pap per gyn, f/u pending.   Mammogram 2017 DXA 08/13/13- wnl  Diet and exercise d/w pt. Exercise had been episodically limited by B shoulder pain. She is putting up with that, taking advil as needed.  She is going to f/u with ortho if her sx get worse. She was prev injected in the shoulder.   Living will d/w pt. Husband designated if patient were incapacitated.  HCV neg prev. D/w pt.   HIV prev neg in ~1995 per patient report.   She has some episodic triggering in the L 3rd finger, but wanted to put off hand clinic referral for now.  She will update me as needed.  Allergies, seasonal.  Still on baseline meds.  No fevers.  She is able to tolerate as is.  Hypertension:    Using medication without problems or lightheadedness: yes Chest pain with exertion:no Edema:no Short of breath:no Average home BPs: improved recently.   [facility].  Labs d/w pt.    GERD.  Controlled, d/w pt about trigger avoidance.  D/w pt about routine cautions with PPI.  "I've always had heartburn."  DM2.  A1c up, d/w pt.  Not on meds.  Diet and exercise had been off for the last few months.  No foot sx.  Not checking sugar at home.  Eye exam done in 06/2016, no retinopathy.    h/o breast cancer.  >5 years from tx, no masses or concerns from patient.  She had f/u with gyn pending.    PMH and SH reviewed  Meds, vitals, and allergies reviewed.   ROS: Per HPI.  Unless specifically indicated otherwise in HPI, the patient  denies:  General: fever. Eyes: acute vision changes ENT: sore throat Cardiovascular: chest pain Respiratory: SOB GI: vomiting GU: dysuria Musculoskeletal: acute back pain Derm: acute rash Neuro: acute motor dysfunction Psych: worsening mood Endocrine: polydipsia Heme: bleeding Allergy: hayfever  GEN: nad, alert and oriented HEENT: mucous membranes moist NECK: supple w/o LA CV: rrr. PULM: ctab, no inc wob ABD: soft, +bs EXT: no edema SKIN: no acute rash Chronic IP changes in the hands.   Diabetic foot exam: Normal inspection No skin breakdown No calluses  Normal DP pulses Normal sensation to light touch and monofilament Nails normal

## 2017-02-20 NOTE — Progress Notes (Signed)
Pre visit review using our clinic review tool, if applicable. No additional management support is needed unless otherwise documented below in the visit note. 

## 2017-02-20 NOTE — Patient Instructions (Signed)
Check your sugar and if not better in 1 month then let me know.  Work on diet and exercise for now.  Recheck A1c in 3 months before a visit.  Take care.  Glad to see you.  Update me as needed.

## 2017-02-21 NOTE — Assessment & Plan Note (Signed)
Tetanus 2014  Flu to be done at the pharmacy.  Shingles 2014  PNA shot 2016.   Colonoscopy 2012  Pap per gyn, f/u pending.   Mammogram 2017 DXA 08/13/13- wnl  Diet and exercise d/w pt. Exercise had been episodically limited by B shoulder pain. She is putting up with that, taking advil as needed.  She is going to f/u with ortho if her sx get worse. She was prev injected in the shoulder.   Living will d/w pt. Husband designated if patient were incapacitated.  HCV neg prev. D/w pt.   HIV prev neg in ~1995 per patient report.

## 2017-02-21 NOTE — Assessment & Plan Note (Signed)
Continue current medication. The main issue is diet and exercise. Discussed with patient. She is going to work hard on both and update me as needed. Labs discussed with patient. Okay for outpatient follow-up.

## 2017-02-21 NOTE — Assessment & Plan Note (Signed)
Greater than 5 years out, doing well. She has follow-up with gynecology pending.

## 2017-02-21 NOTE — Assessment & Plan Note (Signed)
Discussed with patient about diet and exercise.  Labs discussed with patient. 

## 2017-02-21 NOTE — Assessment & Plan Note (Signed)
Discussed with patient about trigger avoidance and weight loss. Reasonable control with medication. Continue as is. Risk and benefit of PPI discussed with patient.

## 2017-02-21 NOTE — Assessment & Plan Note (Signed)
She has had basically no exercise and minimal diet adherence. She will work hard on both. She is going to give that a try and update me in the next few weeks. If not seeing a difference in her home sugar readings then she will update me and we can start medication. Prescription done for meter and strips. She agrees. At this point still okay for outpatient follow-up. Plan on recheck A1c in 3 months.

## 2017-03-13 LAB — HM PAP SMEAR

## 2017-05-22 ENCOUNTER — Other Ambulatory Visit: Payer: BLUE CROSS/BLUE SHIELD

## 2017-05-24 ENCOUNTER — Ambulatory Visit: Payer: BLUE CROSS/BLUE SHIELD | Admitting: Family Medicine

## 2017-06-13 DIAGNOSIS — M19041 Primary osteoarthritis, right hand: Secondary | ICD-10-CM | POA: Diagnosis not present

## 2017-06-13 DIAGNOSIS — M79641 Pain in right hand: Secondary | ICD-10-CM | POA: Diagnosis not present

## 2017-06-13 DIAGNOSIS — M1812 Unilateral primary osteoarthritis of first carpometacarpal joint, left hand: Secondary | ICD-10-CM | POA: Diagnosis not present

## 2017-06-13 DIAGNOSIS — M65332 Trigger finger, left middle finger: Secondary | ICD-10-CM | POA: Diagnosis not present

## 2017-06-13 DIAGNOSIS — M79642 Pain in left hand: Secondary | ICD-10-CM | POA: Diagnosis not present

## 2017-06-21 ENCOUNTER — Encounter: Payer: Self-pay | Admitting: Family Medicine

## 2017-06-21 DIAGNOSIS — R928 Other abnormal and inconclusive findings on diagnostic imaging of breast: Secondary | ICD-10-CM | POA: Diagnosis not present

## 2017-06-21 DIAGNOSIS — Z853 Personal history of malignant neoplasm of breast: Secondary | ICD-10-CM | POA: Diagnosis not present

## 2017-06-21 LAB — HM MAMMOGRAPHY: HM MAMMO: NORMAL (ref 0–4)

## 2017-06-22 ENCOUNTER — Encounter: Payer: Self-pay | Admitting: *Deleted

## 2017-06-22 DIAGNOSIS — Z780 Asymptomatic menopausal state: Secondary | ICD-10-CM | POA: Diagnosis not present

## 2017-06-22 LAB — HM DEXA SCAN

## 2017-06-29 ENCOUNTER — Encounter: Payer: Self-pay | Admitting: Family Medicine

## 2017-07-19 DIAGNOSIS — M65332 Trigger finger, left middle finger: Secondary | ICD-10-CM | POA: Diagnosis not present

## 2017-07-19 DIAGNOSIS — M79641 Pain in right hand: Secondary | ICD-10-CM | POA: Diagnosis not present

## 2017-07-19 DIAGNOSIS — G5603 Carpal tunnel syndrome, bilateral upper limbs: Secondary | ICD-10-CM | POA: Diagnosis not present

## 2017-07-19 DIAGNOSIS — M79642 Pain in left hand: Secondary | ICD-10-CM | POA: Diagnosis not present

## 2017-08-07 DIAGNOSIS — E119 Type 2 diabetes mellitus without complications: Secondary | ICD-10-CM | POA: Diagnosis not present

## 2017-08-07 LAB — HM DIABETES EYE EXAM

## 2017-08-14 ENCOUNTER — Encounter: Payer: Self-pay | Admitting: Family Medicine

## 2017-12-20 ENCOUNTER — Ambulatory Visit (INDEPENDENT_AMBULATORY_CARE_PROVIDER_SITE_OTHER): Payer: Self-pay

## 2017-12-20 ENCOUNTER — Encounter (INDEPENDENT_AMBULATORY_CARE_PROVIDER_SITE_OTHER): Payer: Self-pay | Admitting: Orthopaedic Surgery

## 2017-12-20 ENCOUNTER — Ambulatory Visit (INDEPENDENT_AMBULATORY_CARE_PROVIDER_SITE_OTHER): Payer: PPO | Admitting: Orthopaedic Surgery

## 2017-12-20 VITALS — BP 173/76 | HR 99 | Resp 16 | Ht 61.0 in | Wt 200.0 lb

## 2017-12-20 DIAGNOSIS — G8929 Other chronic pain: Secondary | ICD-10-CM | POA: Diagnosis not present

## 2017-12-20 DIAGNOSIS — M25511 Pain in right shoulder: Secondary | ICD-10-CM | POA: Diagnosis not present

## 2017-12-20 DIAGNOSIS — M25522 Pain in left elbow: Secondary | ICD-10-CM

## 2017-12-20 DIAGNOSIS — M25512 Pain in left shoulder: Secondary | ICD-10-CM | POA: Diagnosis not present

## 2017-12-20 MED ORDER — METHYLPREDNISOLONE ACETATE 40 MG/ML IJ SUSP
80.0000 mg | INTRAMUSCULAR | Status: AC | PRN
  Administered 2017-12-20: 80 mg

## 2017-12-20 MED ORDER — DICLOFENAC SODIUM 1 % TD GEL: TRANSDERMAL | 4 refills | Status: DC

## 2017-12-20 MED ORDER — BUPIVACAINE HCL 0.5 % IJ SOLN
2.0000 mL | INTRAMUSCULAR | Status: AC | PRN
  Administered 2017-12-20: 2 mL via INTRA_ARTICULAR

## 2017-12-20 MED ORDER — LIDOCAINE HCL 2 % IJ SOLN
2.0000 mL | INTRAMUSCULAR | Status: AC | PRN
  Administered 2017-12-20: 2 mL

## 2017-12-20 NOTE — Progress Notes (Signed)
Office Visit Note   Patient: Anita Cooper           Date of Birth: 11/18/1950           MRN: [id number] Visit Date: 12/20/2017              Requested by: Anita Ghent, MD [address] [place], [place] [address] PCP: Anita Ghent, MD   Assessment & Plan: Visit Diagnoses:  1. Chronic pain of both shoulders   2. Chronic left shoulder pain     Plan: Long discussion regarding potential and differential diagnoses of bilateral shoulder pain. Having more difficulty on the left which is recurrent pain than the right. I will inject the subacromial space on the right in order an MRI scan of the left shoulder. Left elbow pain appears to be tennis elbow. We will prescribe Voltaren gel. Consider further treatment pending results of the MRI scan. Has had pain in the left shoulder off and on for at least 3 years as mentioned below.  Follow-Up Instructions: Return after MRI left shoulder.   Orders:  Orders Placed This Encounter  Procedures  . XR Elbow 2 Views Left  . XR Shoulder Right  . XR Shoulder Left  . MR Shoulder Left w/o contrast   Meds ordered this encounter  Medications  . diclofenac sodium (VOLTAREN) 1 % GEL    Sig: Apply to right elbow TID prn    Dispense:  3 Tube    Refill:  4      Procedures: Large Joint Inj: R subacromial bursa on 12/20/2017 12:08 PM Indications: pain and diagnostic evaluation Details: 25 G 1.5 in needle, anterolateral approach  Arthrogram: No  Medications: 2 mL lidocaine 2 %; 2 mL bupivacaine 0.5 %; 80 mg methylPREDNISolone acetate 40 MG/ML Consent was given by the patient. Immediately prior to procedure a time out was called to verify the correct patient, procedure, equipment, support staff and site/side marked as required. Patient was prepped and draped in the usual sterile fashion.       Clinical Data: No additional findings.   Subjective: Chief Complaint  Patient presents with  . Left Shoulder - Pain    Mrs. Anita Cooper  is a 67 y o here today for recurrent Bilateral shoulder pain, . Pain has become worse over the last few months.  . Left Elbow - Pain    She also says her Left elbow has been hurting; hx of fracture 25+ years ago. She has noticed some muscle weakness   Anita Cooper was seen in 2015 for evaluation of left shoulder pain. At the time I injected the subacromial space with relief of her pain for several months. She's had recurrence of bilateral shoulder pain left greater than right over a number of months to the point where she's had some compromise of her activities. No new injury or trauma. She's had some "tightness" with overhead activity. No numbness or tingling to either upper extremity. Left elbow pain appears to be localized along the lateral aspect of her elbow and a problem with grip more in extension than with flexion. No injury or trauma  HPI  Review of Systems  Constitutional: Negative for chills, fatigue and fever.  Eyes: Negative for itching.  Respiratory: Negative for chest tightness and shortness of breath.   Cardiovascular: Negative for chest pain, palpitations and leg swelling.  Gastrointestinal: Negative for blood in stool, constipation and diarrhea.  Endocrine: Negative for polyuria.  Genitourinary: Negative for dysuria.  Musculoskeletal: Negative for back pain, joint swelling, neck pain and neck stiffness.  Allergic/Immunologic: Negative for immunocompromised state.  Neurological: Negative for dizziness and numbness.  Hematological: Does not bruise/bleed easily.  Psychiatric/Behavioral: The patient is not nervous/anxious.      Objective: Vital Signs: BP (!) 173/76   Pulse 99   Resp 16   Ht 5\' 1"  (1.549 m)   Wt 200 lb (90.7 kg)   BMI 37.79 kg/m   Physical Exam  Ortho Exam awake alert and oriented 3 comfortable sitting positive impingement both right and left shoulder but more on the left than the right. No weakness. Minimally positive empty can testing on the left  but not the right. Skin intact. Some tenderness over the acromioclavicular joints bilaterally. Biceps intact. Good grip and good release except on the left where there is some pain along the lateral elbow with grip in extension. Skin intact. No pain with range of motion of the cervical spine. I thought she lacked the last 20 of full overhead motion of both shoulders. No grating or crepitation. Left elbow with full range of motion of pronation supination flexion and extension. Some mild discomfort over the lateral epicondyle consistent with tennis elbow. Does have some pain with grip in extension in that area.  Specialty Comments:  No specialty comments available.  Imaging: Xr Elbow 2 Views Left  Result Date: 12/20/2017 Films of the left elbow were obtained in 2 projections. There is some spurring off the medial ulna possibly consistent with a remote injury. Lateral film is without evidence of fracture or effusion. Joint spaces well maintained. No ectopic calcification. Clinically patient has tennis elbow  Xr Shoulder Left  Result Date: 12/20/2017 Films of the left shoulder obtained in several projections. There are some degenerative changes at the acromioclavicular joint with inferiorly directed spurs off the distal clavicle. Normal space between the humeral head and the acromion. Some irregularity along the greater tuberosity. No ectopic calcification. Humeral head is centered about the glenoid. Possibly some inferiorly directed spurring off the glenoid. The glenohumeral joint space appears to be well maintained  Xr Shoulder Right  Result Date: 12/20/2017 Films of the right shoulder were obtained in several projections there is considerable hypertrophic degenerative changes at the acromioclavicular joint. There is slight downsloping of the lateral acromion. No ectopic calcification. No evidence of fracture or dislocation. The humeral head is centered about the glenoid.    PMFS History: Patient  Active Problem List   Diagnosis Date Noted  . Allergic rhinitis 01/29/2014  . Diabetes mellitus without complication ([place]) 56/25/6389  . Routine general medical examination at a health care facility 12/03/2012  . History of breast cancer in female 08/07/2011  . S/P colonoscopy 05/18/2011  . GERD (gastroesophageal reflux disease) 05/18/2011  . HYPERCHOLESTEROLEMIA 08/31/2010  . CARPAL TUNNEL SYNDROME 08/31/2010  . HYPERTENSION, BENIGN 08/19/2010   Past Medical History:  Diagnosis Date  . Arthritis   . Cancer [facility]) 2011 - Released by Dr. Jana Hakim 2012   stage 0 right breast S/P lumpectomy and sentinel node biopsy and radiation - per Dr. Donne Hazel.  . Carpal tunnel syndrome   . Diabetes mellitus without complication ([place])   . Elevated liver function tests    Mild increase due to fatty liver seen on [place], normal after weight loss  . GERD (gastroesophageal reflux disease)    Lack of relief with Prilosec and other OTC PPI's  . Hyperlipidemia   . Hypertension    Benign    Family History  Problem  Relation Age of Onset  . Cancer Father        Lung  . Colon cancer Neg Hx     Past Surgical History:  Procedure Laterality Date  . BREAST LUMPECTOMY  2011   right lumpectomy, sentinel node biopsy  . KNEE ARTHROSCOPY     Bilaterally   Social History   Occupational History  . Occupation: Full time  Tobacco Use  . Smoking status: Never Smoker  . Smokeless tobacco: Never Used  Substance and Sexual Activity  . Alcohol use: No    Alcohol/week: 0.0 oz  . Drug use: No  . Sexual activity: Not on file

## 2018-01-07 ENCOUNTER — Ambulatory Visit (INDEPENDENT_AMBULATORY_CARE_PROVIDER_SITE_OTHER): Payer: PPO | Admitting: Family Medicine

## 2018-01-07 ENCOUNTER — Encounter: Payer: Self-pay | Admitting: Family Medicine

## 2018-01-07 ENCOUNTER — Other Ambulatory Visit: Payer: Self-pay

## 2018-01-07 DIAGNOSIS — R6889 Other general symptoms and signs: Secondary | ICD-10-CM | POA: Diagnosis not present

## 2018-01-07 MED ORDER — OSELTAMIVIR PHOSPHATE 75 MG PO CAPS: 75.0000 mg | ORAL_CAPSULE | Freq: Two times a day (BID) | ORAL | 0 refills | Status: DC

## 2018-01-07 NOTE — Progress Notes (Signed)
Husband has been sick at home.    duration of symptoms: about 5 days.  She initially attributed it to allergies. Then had more sinus pain and fever last night, 101.2 with chills.  Diffuse aches.  She didn't bad until yesterday, the last 24 hours are clearly different.   rhinorrhea:yes congestion:yes ear pain:no sore throat: only from post nasal gtt cough:yes myalgias:yes other concerns: vomited once last night.  Taking mucinex.   She is going to come back for a physical/DM2.  She had a flu shot in the fall.    Per HPI unless specifically indicated in ROS section   Meds, vitals, and allergies reviewed.   GEN: nad, alert and oriented HEENT: mucous membranes moist, TM w/o erythema, nasal epithelium injected, OP with cobblestoning NECK: supple w/o LA CV: rrr. PULM: ctab, no inc wob ABD: soft, +bs EXT: no edema

## 2018-01-07 NOTE — Patient Instructions (Addendum)
Presumed flu.  Lungs are clear.  Start tamiflu.  Update me as needed.  Schedule a yearly physical when possible.   Take care.  Glad to see you.

## 2018-01-07 NOTE — Assessment & Plan Note (Signed)
Presumed flu.  Lungs are clear.  Start tamiflu.  Wouldn't need testing at this point.  She agrees.   Update me as needed.  Okay for outpatient f/u.

## 2018-01-08 ENCOUNTER — Ambulatory Visit (INDEPENDENT_AMBULATORY_CARE_PROVIDER_SITE_OTHER): Payer: PPO | Admitting: Orthopaedic Surgery

## 2018-01-10 ENCOUNTER — Telehealth: Payer: Self-pay

## 2018-01-10 NOTE — Telephone Encounter (Signed)
Copied from [place]. Topic: General - Other >> Jan 10, 2018  9:49 AM Carolyn Stare wrote:  Pt call to ask if it would hurt her if she stop taking TAMUFLU she said she thinks it is making her feel worse   [phone]

## 2018-01-10 NOTE — Telephone Encounter (Signed)
Patient notified as instructed by telephone and verbalized understanding. Patient stated that she is feeling better from the flu symptoms. Patient stated that she has a bad cough and has been taking Delsym for that which does help some. Patient stated that she is out of town and plans on being home tomorrow and if she feels that she needs something else for her cough she will call back.

## 2018-01-10 NOTE — Telephone Encounter (Signed)
Stop it and please get update on patient.  Thanks.

## 2018-01-11 ENCOUNTER — Ambulatory Visit: Payer: Self-pay

## 2018-01-11 ENCOUNTER — Encounter: Payer: Self-pay | Admitting: Emergency Medicine

## 2018-01-11 ENCOUNTER — Other Ambulatory Visit: Payer: Self-pay

## 2018-01-11 ENCOUNTER — Ambulatory Visit
Admission: EM | Admit: 2018-01-11 | Discharge: 2018-01-11 | Disposition: A | Discharge status: Home / Self Care | Payer: PPO | Attending: Family Medicine | Admitting: Family Medicine

## 2018-01-11 DIAGNOSIS — R51 Headache: Secondary | ICD-10-CM | POA: Diagnosis not present

## 2018-01-11 DIAGNOSIS — M791 Myalgia, unspecified site: Secondary | ICD-10-CM | POA: Diagnosis not present

## 2018-01-11 DIAGNOSIS — R509 Fever, unspecified: Secondary | ICD-10-CM | POA: Diagnosis not present

## 2018-01-11 DIAGNOSIS — J111 Influenza due to unidentified influenza virus with other respiratory manifestations: Secondary | ICD-10-CM

## 2018-01-11 DIAGNOSIS — R69 Illness, unspecified: Secondary | ICD-10-CM

## 2018-01-11 DIAGNOSIS — R05 Cough: Secondary | ICD-10-CM | POA: Diagnosis not present

## 2018-01-11 MED ORDER — BENZONATATE 200 MG PO CAPS: 200.0000 mg | ORAL_CAPSULE | Freq: Three times a day (TID) | ORAL | 0 refills | Status: DC | PRN

## 2018-01-11 MED ORDER — HYDROCOD POLST-CPM POLST ER 10-8 MG/5ML PO SUER: 5.0000 mL | Freq: Two times a day (BID) | ORAL | 0 refills | Status: DC | PRN

## 2018-01-11 NOTE — Telephone Encounter (Signed)
Pt. called re: freq cough; saw MD on 2/25, was prescribed Tamiflu, but couldn't continue it, due to adverse effects.  Reported temp. of 99.5; reported this was checked about 3 hrs. after taking Advil.  Stated cough is non-productive, but much more persistent.  Has been using Delsym and Mucinex for her cough.  Reported increased resp. rate, and stated "it's hard to take a deep breath because it makes me cough."  Also reported she can't sleep, due to the persistent cough.  C/o headache, and increased sinus pressure. Feels that she is not improving.  Reported the fever had been down, and has resumed today.   Pt. stated she did not want to drive to [place].  Advised she should go to [facility] today.  Verb. understanding.               Reason for Disposition . [1] Fever returns after gone for over 24 hours AND [2] symptoms worse or not improved  Answer Assessment - Initial Assessment Questions 1. DIAGNOSIS CONFIRMATION: "When was the influenza diagnosed?" "By whom?" "Did you get a test for it?"     01/07/18 diagnosed with Flu 2. MEDICINES: "Were you prescribed any medications for the influenza?"  (e.g., zanamivir [Relenza], oseltamavir [Tamiflu]).      Tamiflu 3. ONSET of SYMPTOMS: "When did your symptoms start?"     Saturday 4. SYMPTOMS: "What symptoms are you most concerned about?" (e.g., runny nose, stuffy nose, sore throat, cough, breathing difficulty, fever)     Fever, nonprod. Cough, increased pressure in sinus region, nasal drainage is white to clear with blood tinge  5. COUGH: "How bad is the cough?"     Freq. cough 6. FEVER: "Do you have a fever?" If so, ask: "What is your temperature, how was it measured, and when did it start?"     99.5 at about 2:15 PM 7. RESPIRATORY DISTRESS: "Are you having any trouble breathing?" If yes, ask: "Describe your breathing."      Denied shortness of breath; hard to take deep breath because it provokes a cough; increased resp. Rate. 8. FLU VACCINE: "Did you  receive a flu shot this year?" (e.g., seasonal influenza, H1N1)     Yes, 08/2017  9. PREGNANCY: "Is there any chance you are pregnant?" "When was your last menstrual period?"     n/a 10. HIGH RISK for COMPLICATIONS: "Do you have any heart or lung problems? Do you have a weakened immune system?" (e.g., CHF, COPD, asthma, HIV positive, chemotherapy, renal failure, diabetes mellitus, sickle cell anemia)       Diabetes  Protocols used: INFLUENZA FOLLOW-UP CALL-A-AH

## 2018-01-11 NOTE — ED Triage Notes (Signed)
Patient was diagnosed with the flu on Monday by her PCP.  Patient c/o ongoing cough and chest congestion. Patient reports fevers.  Patient states that she only took Tamiflu for the past 3 days

## 2018-01-11 NOTE — ED Provider Notes (Signed)
[facility]    CSN: [id number] Arrival date & time: 01/11/18  1609     History   Chief Complaint Chief Complaint  Patient presents with  . Cough    APPOINTMENT    HPI Anita Cooper is a 67 y.o. female.   The history is provided by the patient.  Cough  Associated symptoms: fever, headaches, myalgias and rhinorrhea   Associated symptoms: no wheezing   URI  Presenting symptoms: congestion, cough, fatigue, fever and rhinorrhea   Severity:  Moderate Onset quality:  Sudden Duration:  5 days Timing:  Constant Progression:  Unchanged Chronicity:  New Relieved by:  Nothing Ineffective treatments:  OTC medications and prescription medications (prescribed Tamiflu by PCP on Monday and patient took for 3 days but had to stop due to side effects from it.) Associated symptoms: headaches and myalgias   Associated symptoms: no wheezing   Risk factors: sick contacts   Risk factors: not elderly, no chronic cardiac disease, no chronic kidney disease, no chronic respiratory disease, no immunosuppression, no recent illness and no recent travel     Past Medical History:  Diagnosis Date  . Arthritis   . Cancer [facility]) 2011 - Released by Dr. Jana Hakim 2012   stage 0 right breast S/P lumpectomy and sentinel node biopsy and radiation - per Dr. Donne Hazel.  . Carpal tunnel syndrome   . Diabetes mellitus without complication ([place])   . Elevated liver function tests    Mild increase due to fatty liver seen on [place], normal after weight loss  . GERD (gastroesophageal reflux disease)    Lack of relief with Prilosec and other OTC PPI's  . Hyperlipidemia   . Hypertension    Benign    Patient Active Problem List   Diagnosis Date Noted  . Flu-like symptoms 01/07/2018  . Allergic rhinitis 01/29/2014  . Diabetes mellitus without complication ([place]) 01/75/1025  . Routine general medical examination at a health care facility 12/03/2012  . History of breast cancer in female 08/07/2011  .  S/P colonoscopy 05/18/2011  . GERD (gastroesophageal reflux disease) 05/18/2011  . HYPERCHOLESTEROLEMIA 08/31/2010  . CARPAL TUNNEL SYNDROME 08/31/2010  . HYPERTENSION, BENIGN 08/19/2010    Past Surgical History:  Procedure Laterality Date  . BREAST LUMPECTOMY  2011   right lumpectomy, sentinel node biopsy  . KNEE ARTHROSCOPY     Bilaterally    OB History    No data available       Home Medications    Prior to Admission medications   Medication Sig Start Date End Date Taking? Authorizing Provider  aspirin 81 MG tablet Take 81 mg by mouth daily.     Yes [provider]  esomeprazole (NEXIUM) 20 MG capsule Take 2 capsules (40 mg total) by mouth daily before breakfast. 07/04/13  Yes Tonia Ghent, MD  fexofenadine (ALLEGRA) 180 MG tablet Take 180 mg by mouth daily.   Yes [provider]  fluticasone (FLONASE) 50 MCG/ACT nasal spray USE 1-2 SPRAYS PER NOSTRIL ONCE A DAY 02/20/17  Yes Tonia Ghent, MD  Multiple Vitamin (MULTIVITAMIN) tablet Take 1 tablet by mouth daily.     Yes [provider]  olmesartan-hydrochlorothiazide (BENICAR HCT) 40-25 MG tablet Take 0.5 tablets by mouth daily. 02/20/17  Yes Tonia Ghent, MD  benzonatate (TESSALON) 200 MG capsule Take 1 capsule (200 mg total) by mouth 3 (three) times daily as needed. 01/11/18   Norval Gable, MD  chlorpheniramine-HYDROcodone (TUSSIONEX PENNKINETIC ER) 10-8 MG/5ML SUER Take 5  mLs by mouth every 12 (twelve) hours as needed. 01/11/18   Norval Gable, MD  diclofenac sodium (VOLTAREN) 1 % GEL Apply to right elbow TID prn 12/20/17   Garald Balding, MD  ibuprofen (ADVIL) 200 MG tablet Take 400 mg by mouth every 6 (six) hours as needed.      [provider]  oseltamivir (TAMIFLU) 75 MG capsule Take 1 capsule (75 mg total) by mouth 2 (two) times daily. 01/07/18   Tonia Ghent, MD    Family History Family History  Problem Relation Age of Onset  . Cancer Father        Lung  . Colon  cancer Neg Hx     Social History Social History   Tobacco Use  . Smoking status: Never Smoker  . Smokeless tobacco: Never Used  Substance Use Topics  . Alcohol use: No    Alcohol/week: 0.0 oz  . Drug use: No     Allergies   Benadryl [diphenhydramine hcl] and Losartan   Review of Systems Review of Systems  Constitutional: Positive for fatigue and fever.  HENT: Positive for congestion and rhinorrhea.   Respiratory: Positive for cough. Negative for wheezing.   Musculoskeletal: Positive for myalgias.  Neurological: Positive for headaches.     Physical Exam Triage Vital Signs ED Triage Vitals  Enc Vitals Group     BP 01/11/18 1623 (!) 157/71     Pulse Rate 01/11/18 1623 (!) 109     Resp 01/11/18 1623 16     Temp 01/11/18 1623 100.2 F (37.9 C)     Temp Source 01/11/18 1623 Oral     SpO2 01/11/18 1623 97 %     Weight 01/11/18 1620 209 lb (94.8 kg)     Height 01/11/18 1620 5\' 1"  (1.549 m)     Head Circumference --      Peak Flow --      Pain Score 01/11/18 1620 5     Pain Loc --      Pain Edu? --      Excl. in [place]? --    No data found.  Updated Vital Signs BP (!) 157/71 (BP Location: Left Arm)   Pulse (!) 109   Temp 100.2 F (37.9 C) (Oral) Comment: Patient took Ibuprofen at 3pm today  Resp 16   Ht 5\' 1"  (1.549 m)   Wt 209 lb (94.8 kg)   SpO2 97%   BMI 39.49 kg/m   Visual Acuity Right Eye Distance:   Left Eye Distance:   Bilateral Distance:    Right Eye Near:   Left Eye Near:    Bilateral Near:     Physical Exam  Constitutional: She appears well-developed and well-nourished. No distress.  HENT:  Head: Normocephalic and atraumatic.  Right Ear: Tympanic membrane, external ear and ear canal normal.  Left Ear: Tympanic membrane, external ear and ear canal normal.  Nose: No mucosal edema, rhinorrhea, nose lacerations, sinus tenderness, nasal deformity, septal deviation or nasal septal hematoma. No epistaxis.  No foreign bodies. Right sinus exhibits no  maxillary sinus tenderness and no frontal sinus tenderness. Left sinus exhibits no maxillary sinus tenderness and no frontal sinus tenderness.  Mouth/Throat: Uvula is midline, oropharynx is clear and moist and mucous membranes are normal. No oropharyngeal exudate.  Eyes: Conjunctivae are normal. Right eye exhibits no discharge. Left eye exhibits no discharge. No scleral icterus.  Neck: Normal range of motion. Neck supple. No thyromegaly present.  Cardiovascular: Normal rate, regular rhythm and  normal heart sounds.  Pulmonary/Chest: Effort normal and breath sounds normal. No stridor. No respiratory distress. She has no wheezes. She has no rales.  Lymphadenopathy:    She has no cervical adenopathy.  Skin: She is not diaphoretic.  Nursing note and vitals reviewed.    UC Treatments / Results  Labs (all labs ordered are listed, but only abnormal results are displayed) Labs Reviewed - No data to display  EKG  EKG Interpretation None       Radiology No results found.  Procedures Procedures (including critical care time)  Medications Ordered in UC Medications - No data to display   Initial Impression / Assessment and Plan / UC Course  I have reviewed the triage vital signs and the nursing notes.  Pertinent labs & imaging results that were available during my care of the patient were reviewed by me and considered in my medical decision making (see chart for details).       Final Clinical Impressions(s) / UC Diagnoses   Final diagnoses:  Influenza-like illness    ED Discharge Orders        Ordered    chlorpheniramine-HYDROcodone (TUSSIONEX PENNKINETIC ER) 10-8 MG/5ML SUER  Every 12 hours PRN     01/11/18 1657    benzonatate (TESSALON) 200 MG capsule  3 times daily PRN     01/11/18 1658     1. diagnosis reviewed with patient 2. rx as per orders above; reviewed possible side effects, interactions, risks and benefits  3. Recommend supportive treatment with rest, fluids,  otc tylenol and/or ibuprofen 4. Follow-up prn if symptoms worsen or don't improve  Controlled Substance Prescriptions [place] Controlled Substance Registry consulted? Not Applicable   Norval Gable, MD 01/11/18 [phone]

## 2018-01-13 NOTE — Telephone Encounter (Signed)
Please call and get update on patient Monday.  Thanks.

## 2018-01-14 MED ORDER — AMOXICILLIN-POT CLAVULANATE 875-125 MG PO TABS: 1.0000 | ORAL_TABLET | Freq: Two times a day (BID) | ORAL | 0 refills | Status: DC

## 2018-01-14 NOTE — Telephone Encounter (Signed)
At this point, I would start augmentin for a presumed sinus infection.  Try to get some rest and drink plenty of fluids and update [place] as needed.  Thanks.

## 2018-01-14 NOTE — Addendum Note (Signed)
Addended by: Tonia Ghent on: 01/14/2018 05:10 PM   Modules accepted: Orders

## 2018-01-14 NOTE — Telephone Encounter (Signed)
Spoke to patient by telephone and was advised that she is not much better. Patient stated that she went to the Urgent Care in [facility] Friday and they told her she still had the flu. Patient stated that her sinuses are really bothering her and she has a headache. Patient stated that she is has been taking Advil and it helps. Patient stated that she is not running a fever now. Patient stated that they gave her cough medication to take at night and it does help. Patient stated that she is coughing up green stuff and blowing it out of her nose too. Pharmacy [facility]

## 2018-01-14 NOTE — Telephone Encounter (Signed)
Patient notified as instructed by telephone and verbalized understanding. 

## 2018-01-15 ENCOUNTER — Ambulatory Visit
Admission: RE | Admit: 2018-01-15 | Discharge: 2018-01-15 | Disposition: A | Discharge status: Home / Self Care | Payer: PPO | Source: Ambulatory Visit | Attending: Orthopaedic Surgery | Admitting: Orthopaedic Surgery

## 2018-01-15 DIAGNOSIS — G8929 Other chronic pain: Secondary | ICD-10-CM

## 2018-01-15 DIAGNOSIS — M25512 Pain in left shoulder: Principal | ICD-10-CM

## 2018-01-15 DIAGNOSIS — M75122 Complete rotator cuff tear or rupture of left shoulder, not specified as traumatic: Secondary | ICD-10-CM | POA: Diagnosis not present

## 2018-01-18 ENCOUNTER — Ambulatory Visit (INDEPENDENT_AMBULATORY_CARE_PROVIDER_SITE_OTHER): Payer: PPO | Admitting: Orthopaedic Surgery

## 2018-01-18 ENCOUNTER — Other Ambulatory Visit: Payer: Self-pay | Admitting: *Deleted

## 2018-01-18 ENCOUNTER — Encounter (INDEPENDENT_AMBULATORY_CARE_PROVIDER_SITE_OTHER): Payer: Self-pay | Admitting: Orthopaedic Surgery

## 2018-01-18 VITALS — BP 139/68 | HR 90 | Resp 16 | Ht 61.0 in | Wt 209.0 lb

## 2018-01-18 DIAGNOSIS — M25511 Pain in right shoulder: Secondary | ICD-10-CM | POA: Diagnosis not present

## 2018-01-18 DIAGNOSIS — M25512 Pain in left shoulder: Secondary | ICD-10-CM | POA: Diagnosis not present

## 2018-01-18 DIAGNOSIS — G8929 Other chronic pain: Secondary | ICD-10-CM | POA: Diagnosis not present

## 2018-01-18 MED ORDER — FLUTICASONE PROPIONATE 50 MCG/ACT NA SUSP: NASAL | 3 refills | Status: DC

## 2018-01-18 NOTE — Progress Notes (Signed)
Office Visit Note   Patient: Anita Cooper           Date of Birth: July 15, 1951           MRN: [id number] Visit Date: 01/18/2018              Requested by: Tonia Ghent, MD [address] [place], [place] [address] PCP: Tonia Ghent, MD   Assessment & Plan: Visit Diagnoses:  1. Chronic pain of both shoulders     Plan: Bilateral shoulder pain as previously outlined. Some relief with cortisone injection right shoulder. MRI scan left shoulder reveals about a 1.1 cm bursal surface tear of the supraspinatus with a very small full-thickness defect. Long discussion regarding the results. I think a course of physical therapy would be helpful. We'll set this up at [facility] in [place] and we will plan to see her back as necessary to evaluate either shoulder or the left tennis elbow which is doing well with the splint and the Pennsaid  Follow-Up Instructions: Return if symptoms worsen or fail to improve.   Orders:  No orders of the defined types were placed in this encounter.  No orders of the defined types were placed in this encounter.     Procedures: No procedures performed   Clinical Data: No additional findings.   Subjective: Chief Complaint  Patient presents with  . Left Shoulder - Follow-up    LEFT SHOULDER MRI REVIEW  A little better with a subacromial cortisone injection on the right. Not much difference on the left but not functionally limiting. Combination of tennis elbow splint and Pennsaid has helped with the left tennis elbow  HPI  Review of Systems  Constitutional: Negative for fatigue.  HENT: Negative for ear pain.   Eyes: Negative for pain.  Respiratory: Negative for cough and shortness of breath.   Cardiovascular: Negative for chest pain, palpitations and leg swelling.  Gastrointestinal: Negative for blood in stool, constipation and diarrhea.  Genitourinary: Negative for dysuria.  Musculoskeletal: Negative for back pain and neck pain.    Skin: Negative for rash.  Allergic/Immunologic: Negative for food allergies.  Neurological: Negative for dizziness, weakness, light-headedness, numbness and headaches.  Hematological: Does not bruise/bleed easily.  Psychiatric/Behavioral: Positive for sleep disturbance.     Objective: Vital Signs: BP 139/68 (BP Location: Left Arm, Patient Position: Sitting, Cuff Size: Normal)   Pulse 90   Resp 16   Ht 5\' 1"  (1.549 m)   Wt 209 lb (94.8 kg)   BMI 39.49 kg/m   Physical Exam  Ortho Exam awake alert and oriented 3. Comfortable sitting. Very minimal impingement syndrome left shoulder. Negative empty can testing. Skin intact. Full overhead motion. No loss of movement. Minimal discomfort on the right side with minimal impingement. Able to place her arm fully over her head. Good grip good release. Neurovascular exam intact  Specialty Comments:  No specialty comments available.  Imaging: No results found.   PMFS History: Patient Active Problem List   Diagnosis Date Noted  . Flu-like symptoms 01/07/2018  . Allergic rhinitis 01/29/2014  . Diabetes mellitus without complication ([place]) 37/62/8315  . Routine general medical examination at a health care facility 12/03/2012  . History of breast cancer in female 08/07/2011  . S/P colonoscopy 05/18/2011  . GERD (gastroesophageal reflux disease) 05/18/2011  . HYPERCHOLESTEROLEMIA 08/31/2010  . CARPAL TUNNEL SYNDROME 08/31/2010  . HYPERTENSION, BENIGN 08/19/2010   Past Medical History:  Diagnosis Date  . Arthritis   . Cancer (  [place]) 2011 - Released by Dr. Jana Hakim 2012   stage 0 right breast S/P lumpectomy and sentinel node biopsy and radiation - per Dr. Donne Hazel.  . Carpal tunnel syndrome   . Diabetes mellitus without complication ([place])   . Elevated liver function tests    Mild increase due to fatty liver seen on [place], normal after weight loss  . GERD (gastroesophageal reflux disease)    Lack of relief with Prilosec and other OTC PPI's   . Hyperlipidemia   . Hypertension    Benign    Family History  Problem Relation Age of Onset  . Cancer Father        Lung  . Colon cancer Neg Hx     Past Surgical History:  Procedure Laterality Date  . BREAST LUMPECTOMY  2011   right lumpectomy, sentinel node biopsy  . KNEE ARTHROSCOPY     Bilaterally   Social History   Occupational History  . Occupation: Full time  Tobacco Use  . Smoking status: Never Smoker  . Smokeless tobacco: Never Used  Substance and Sexual Activity  . Alcohol use: No    Alcohol/week: 0.0 oz  . Drug use: No  . Sexual activity: Not on file

## 2018-01-31 DIAGNOSIS — B078 Other viral warts: Secondary | ICD-10-CM | POA: Diagnosis not present

## 2018-01-31 DIAGNOSIS — D2262 Melanocytic nevi of left upper limb, including shoulder: Secondary | ICD-10-CM | POA: Diagnosis not present

## 2018-01-31 DIAGNOSIS — L814 Other melanin hyperpigmentation: Secondary | ICD-10-CM | POA: Diagnosis not present

## 2018-01-31 DIAGNOSIS — D2261 Melanocytic nevi of right upper limb, including shoulder: Secondary | ICD-10-CM | POA: Diagnosis not present

## 2018-01-31 DIAGNOSIS — L821 Other seborrheic keratosis: Secondary | ICD-10-CM | POA: Diagnosis not present

## 2018-01-31 DIAGNOSIS — D224 Melanocytic nevi of scalp and neck: Secondary | ICD-10-CM | POA: Diagnosis not present

## 2018-01-31 DIAGNOSIS — L853 Xerosis cutis: Secondary | ICD-10-CM | POA: Diagnosis not present

## 2018-01-31 DIAGNOSIS — D225 Melanocytic nevi of trunk: Secondary | ICD-10-CM | POA: Diagnosis not present

## 2018-03-08 ENCOUNTER — Encounter: Payer: Self-pay | Admitting: *Deleted

## 2018-03-08 ENCOUNTER — Other Ambulatory Visit: Payer: Self-pay | Admitting: Family Medicine

## 2018-03-11 DIAGNOSIS — M25512 Pain in left shoulder: Secondary | ICD-10-CM | POA: Diagnosis not present

## 2018-03-11 DIAGNOSIS — M25511 Pain in right shoulder: Secondary | ICD-10-CM | POA: Diagnosis not present

## 2018-03-12 ENCOUNTER — Ambulatory Visit (INDEPENDENT_AMBULATORY_CARE_PROVIDER_SITE_OTHER): Payer: PPO | Admitting: Family Medicine

## 2018-03-12 ENCOUNTER — Other Ambulatory Visit: Payer: Self-pay | Admitting: Family Medicine

## 2018-03-12 ENCOUNTER — Ambulatory Visit: Payer: Self-pay | Admitting: *Deleted

## 2018-03-12 ENCOUNTER — Encounter: Payer: Self-pay | Admitting: Family Medicine

## 2018-03-12 VITALS — BP 132/80 | HR 91 | Temp 97.9°F | Ht 61.0 in | Wt 210.0 lb

## 2018-03-12 DIAGNOSIS — M7989 Other specified soft tissue disorders: Secondary | ICD-10-CM

## 2018-03-12 LAB — D-DIMER, QUANTITATIVE: D-Dimer, Quant: 0.65 mcg/mL FEU — ABNORMAL HIGH (ref <0.50)

## 2018-03-12 NOTE — Progress Notes (Signed)
BP 132/80 (BP Location: Left Arm, Patient Position: Sitting, Cuff Size: Large)   Pulse 91   Temp 97.9 F (36.6 C) (Oral)   Ht 5\' 1"  (1.549 m)   Wt 210 lb (95.3 kg)   SpO2 97%   BMI 39.68 kg/m    CC: L ankle swelling Subjective:    Patient ID: Anita Cooper, female    DOB: 10-Apr-1951, 67 y.o.   MRN: [id number]  HPI: Anita Cooper is a 67 y.o. female presenting on 03/12/2018 for Joint Swelling (C/o swelling in left ankle. Has had some pain. Has flown in the past week. Felt some pain in posterior upper LE on 03/02/18, then noticed swelling yesterday.  Says whole leg feels tight. )   Recent trip 03/02/2018 to [place]. While there, felt some cramping L posterior thigh. Sunday on flight back entire leg felt tight "like it wanted to cramp". Last night L ankle started hurting, some swelling present at ankle as well. Points to lateral ankle and up leg. Squeezing discomfort. No pain today. Hasn't tried anything for this yet.  Denies fever, redness or warmth of the leg.  No falls or injury.  Denies dyspnea, chest pain, tightness, cough.  3 hour flight to [place].  Denies personal or family history of blood clots. She does take aspirin daily.  Relevant past medical, surgical, family and social history reviewed and updated as indicated. Interim medical history since our last visit reviewed. Allergies and medications reviewed and updated. Outpatient Medications Prior to Visit  Medication Sig Dispense Refill  . aspirin 81 MG tablet Take 81 mg by mouth daily.      . diclofenac sodium (VOLTAREN) 1 % GEL Apply to right elbow TID prn 3 Tube 4  . esomeprazole (NEXIUM) 20 MG capsule Take 2 capsules (40 mg total) by mouth daily before breakfast. 180 capsule 3  . fexofenadine (ALLEGRA) 180 MG tablet Take 180 mg by mouth daily.    . fluticasone (FLONASE) 50 MCG/ACT nasal spray USE 1-2 SPRAYS PER NOSTRIL ONCE A DAY 48 g 3  . ibuprofen (ADVIL) 200 MG tablet Take 400 mg by mouth every 6 (six) hours as  needed.      . Multiple Vitamin (MULTIVITAMIN) tablet Take 1 tablet by mouth daily.      Marland Kitchen olmesartan-hydrochlorothiazide (BENICAR HCT) 40-25 MG tablet TAKE 1/2 TABLET BY MOUTH EVERY DAY 45 tablet 0  . amoxicillin-clavulanate (AUGMENTIN) 875-125 MG tablet Take 1 tablet by mouth 2 (two) times daily. 20 tablet 0  . benzonatate (TESSALON) 200 MG capsule Take 1 capsule (200 mg total) by mouth 3 (three) times daily as needed. 30 capsule 0  . chlorpheniramine-HYDROcodone (TUSSIONEX PENNKINETIC ER) 10-8 MG/5ML SUER Take 5 mLs by mouth every 12 (twelve) hours as needed. 60 mL 0   No facility-administered medications prior to visit.      Per HPI unless specifically indicated in ROS section below Review of Systems     Objective:    BP 132/80 (BP Location: Left Arm, Patient Position: Sitting, Cuff Size: Large)   Pulse 91   Temp 97.9 F (36.6 C) (Oral)   Ht 5\' 1"  (1.549 m)   Wt 210 lb (95.3 kg)   SpO2 97%   BMI 39.68 kg/m   Wt Readings from Last 3 Encounters:  03/12/18 210 lb (95.3 kg)  01/18/18 209 lb (94.8 kg)  01/11/18 209 lb (94.8 kg)    Physical Exam  Constitutional: She appears well-developed and well-nourished. No distress.  Musculoskeletal: She exhibits  no edema.  2+ DP bilaterally R calf circ diameter 42.5cm L calf circ diameter 43.5cm No significant erythema or warmth to bilateral LE Mild discomfort to palpation L lateral anterior ankle ligament  Skin: Skin is warm and dry. No rash noted. No erythema.  Nursing note and vitals reviewed.  Results for orders placed or performed in visit on 01/07/18  HM MAMMOGRAPHY  Result Value Ref Range   HM Mammogram Self Reported Normal 0-4 Bi-Rad, Self Reported Normal  HM DEXA SCAN  Result Value Ref Range   HM Dexa Scan Done   HM PAP SMEAR  Result Value Ref Range   HM Pap smear Done       Assessment & Plan:   Problem List Items Addressed This Visit    Left leg swelling - Primary    Not quite consistent with DVT - however risk  factors include recent travel, weight. Check D dimer stat. If elevated, discussed will likely need venous US r/o DVT. If negative, consider leg/ankle strain a cause of pain. Pt agrees with plan. Further supportive care reviewed.       Relevant Orders   D-dimer, quantitative (not at [facility])       No orders of the defined types were placed in this encounter.  Orders Placed This Encounter  Procedures  . D-dimer, quantitative (not at [facility])    Call results to [phone]    Follow up plan: No follow-ups on file.  Ria Bush, MD

## 2018-03-12 NOTE — Patient Instructions (Addendum)
D dimer today. We will be in touch with results. If positive, we will order left leg ultrasound.  Continue aspirin, elevate legs, let us know if not improving.

## 2018-03-12 NOTE — Telephone Encounter (Signed)
Pt has appt 03/12/18 at 3:45 with Dr Darnell Level.

## 2018-03-12 NOTE — Telephone Encounter (Signed)
Pt reports air travel last Thursday 03/07/18.. Reports "little twinge like pains back of left thigh," had resolved by Friday.  Flew again Saturday, developed left ankle swelling "Mild, comes and goes." States painful to walk yesterday, not presently. Also states below ankle "maybe a little bluish last night," not presently. Denies any calf pain, negative Homans. Denies any SOB, warmth to leg. States knee "feels tight" but "H/O arthritis."  Afebrile. Same day appt made with Dr. Danise Mina. Instructed to go to ED if any SOB, increased swelling, redness, warmth to leg, any calf pain.  Reason for Disposition . [1] Thigh, calf, or ankle swelling AND [2] only 1 side    Onset 6 days ago  Answer Assessment - Initial Assessment Questions 1. ONSET: "When did the swelling start?" (e.g., minutes, hours, days)     Last Thursday 2. LOCATION: "What part of the leg is swollen?"  "Are both legs swollen or just one leg?"    Left ankle 3. SEVERITY: "How bad is the swelling?" (e.g., localized; mild, moderate, severe)  - Localized - small area of swelling localized to one leg  - MILD pedal edema - swelling limited to foot and ankle, pitting edema < 1/4 inch (6 mm) deep, rest and elevation eliminate most or all swelling  - MODERATE edema - swelling of lower leg to knee, pitting edema > 1/4 inch (6 mm) deep, rest and elevation only partially reduce swelling  - SEVERE edema - swelling extends above knee, facial or hand swelling present      mild 4. REDNESS: "Does the swelling look red or infected?"     no 5. PAIN: "Is the swelling painful to touch?" If so, ask: "How painful is it?"   (Scale 1-10; mild, moderate or severe)     Yesterday, hard to walk. Denies pain presently, no calf pain, negative Homans. 6. FEVER: "Do you have a fever?" If so, ask: "What is it, how was it measured, and when did it start?"     no 7. CAUSE: "What do you think is causing the leg swelling?"     Not sure, air travel last Thursday 03/07/18 and  Saturday 03/09/18. 8. MEDICAL HISTORY: "Do you have a history of heart failure, kidney disease, liver failure, or cancer?"    No 9. RECURRENT SYMPTOM: "Have you had leg swelling before?" If so, ask: "When was the last time?" "What happened that time?"     Knee swelling at times r/t arthritis. 10. OTHER SYMPTOMS: "Do you have any other symptoms?" (e.g., chest pain, difficulty breathing)       no 11. PREGNANCY: "Is there any chance you are pregnant?" "When was your last menstrual period?"       no  Protocols used: LEG SWELLING AND EDEMA-A-AH

## 2018-03-12 NOTE — Assessment & Plan Note (Signed)
Not quite consistent with DVT - however risk factors include recent travel, weight. Check D dimer stat. If elevated, discussed will likely need venous US r/o DVT. If negative, consider leg/ankle strain a cause of pain. Pt agrees with plan. Further supportive care reviewed.

## 2018-03-13 ENCOUNTER — Ambulatory Visit
Admission: RE | Admit: 2018-03-13 | Discharge: 2018-03-13 | Disposition: A | Discharge status: Home / Self Care | Payer: PPO | Source: Ambulatory Visit | Attending: Family Medicine | Admitting: Family Medicine

## 2018-03-13 ENCOUNTER — Telehealth: Payer: Self-pay

## 2018-03-13 DIAGNOSIS — M79605 Pain in left leg: Secondary | ICD-10-CM | POA: Insufficient documentation

## 2018-03-13 DIAGNOSIS — M7989 Other specified soft tissue disorders: Secondary | ICD-10-CM | POA: Diagnosis not present

## 2018-03-13 NOTE — Telephone Encounter (Signed)
Anita Cooper with [facility] has call report for pt's venous US.  States pt is negative for DVT.   Asking if pt is free to leave facility.  Per Dr. Darnell Level, pt is free to leave.

## 2018-03-14 DIAGNOSIS — M25512 Pain in left shoulder: Secondary | ICD-10-CM | POA: Diagnosis not present

## 2018-03-14 DIAGNOSIS — M25511 Pain in right shoulder: Secondary | ICD-10-CM | POA: Diagnosis not present

## 2018-03-20 DIAGNOSIS — M25512 Pain in left shoulder: Secondary | ICD-10-CM | POA: Diagnosis not present

## 2018-03-20 DIAGNOSIS — M25511 Pain in right shoulder: Secondary | ICD-10-CM | POA: Diagnosis not present

## 2018-03-22 DIAGNOSIS — M25511 Pain in right shoulder: Secondary | ICD-10-CM | POA: Diagnosis not present

## 2018-03-22 DIAGNOSIS — M25512 Pain in left shoulder: Secondary | ICD-10-CM | POA: Diagnosis not present

## 2018-03-26 DIAGNOSIS — M25512 Pain in left shoulder: Secondary | ICD-10-CM | POA: Diagnosis not present

## 2018-03-26 DIAGNOSIS — M25511 Pain in right shoulder: Secondary | ICD-10-CM | POA: Diagnosis not present

## 2018-03-28 DIAGNOSIS — M25512 Pain in left shoulder: Secondary | ICD-10-CM | POA: Diagnosis not present

## 2018-03-28 DIAGNOSIS — M25511 Pain in right shoulder: Secondary | ICD-10-CM | POA: Diagnosis not present

## 2018-04-10 ENCOUNTER — Other Ambulatory Visit: Payer: PPO

## 2018-04-10 ENCOUNTER — Other Ambulatory Visit: Payer: Self-pay | Admitting: Family Medicine

## 2018-04-10 DIAGNOSIS — E119 Type 2 diabetes mellitus without complications: Secondary | ICD-10-CM

## 2018-04-11 ENCOUNTER — Encounter: Payer: PPO | Admitting: Family Medicine

## 2018-04-15 DIAGNOSIS — M25512 Pain in left shoulder: Secondary | ICD-10-CM | POA: Diagnosis not present

## 2018-04-15 DIAGNOSIS — M25511 Pain in right shoulder: Secondary | ICD-10-CM | POA: Diagnosis not present

## 2018-04-17 DIAGNOSIS — M25512 Pain in left shoulder: Secondary | ICD-10-CM | POA: Diagnosis not present

## 2018-04-17 DIAGNOSIS — M25511 Pain in right shoulder: Secondary | ICD-10-CM | POA: Diagnosis not present

## 2018-05-20 ENCOUNTER — Other Ambulatory Visit (INDEPENDENT_AMBULATORY_CARE_PROVIDER_SITE_OTHER): Payer: PPO

## 2018-05-20 DIAGNOSIS — E119 Type 2 diabetes mellitus without complications: Secondary | ICD-10-CM

## 2018-05-20 LAB — COMPREHENSIVE METABOLIC PANEL
ALBUMIN: 3.9 g/dL (ref 3.5–5.2)
ALT: 78 U/L — ABNORMAL HIGH (ref 0–35)
AST: 62 U/L — AB (ref 0–37)
Alkaline Phosphatase: 62 U/L (ref 39–117)
BUN: 13 mg/dL (ref 6–23)
CO2: 31 meq/L (ref 19–32)
CREATININE: 0.79 mg/dL (ref 0.40–1.20)
Calcium: 9.4 mg/dL (ref 8.4–10.5)
Chloride: 100 mEq/L (ref 96–112)
GFR: 77.38 mL/min (ref >60.00)
Glucose, Bld: 209 mg/dL — ABNORMAL HIGH (ref 70–99)
Potassium: 3.7 mEq/L (ref 3.5–5.1)
SODIUM: 139 meq/L (ref 135–145)
Total Bilirubin: 0.6 mg/dL (ref 0.2–1.2)
Total Protein: 7 g/dL (ref 6.0–8.3)

## 2018-05-20 LAB — LIPID PANEL
Cholesterol: 182 mg/dL (ref 0–200)
HDL: 43.7 mg/dL (ref >39.00)
LDL CALC: 110 mg/dL — AB (ref 0–99)
NONHDL: 138.48
Total CHOL/HDL Ratio: 4
Triglycerides: 140 mg/dL (ref 0.0–149.0)
VLDL: 28 mg/dL (ref 0.0–40.0)

## 2018-05-20 LAB — HEMOGLOBIN A1C: Hgb A1c MFr Bld: 10.9 % — ABNORMAL HIGH (ref 4.6–6.5)

## 2018-05-23 ENCOUNTER — Ambulatory Visit (INDEPENDENT_AMBULATORY_CARE_PROVIDER_SITE_OTHER): Payer: PPO | Admitting: Family Medicine

## 2018-05-23 ENCOUNTER — Encounter: Payer: Self-pay | Admitting: Family Medicine

## 2018-05-23 VITALS — BP 122/80 | HR 91 | Temp 98.5°F | Ht 60.75 in | Wt 208.5 lb

## 2018-05-23 DIAGNOSIS — E119 Type 2 diabetes mellitus without complications: Secondary | ICD-10-CM | POA: Diagnosis not present

## 2018-05-23 DIAGNOSIS — Z0000 Encounter for general adult medical examination without abnormal findings: Secondary | ICD-10-CM | POA: Diagnosis not present

## 2018-05-23 DIAGNOSIS — I10 Essential (primary) hypertension: Secondary | ICD-10-CM

## 2018-05-23 DIAGNOSIS — Z23 Encounter for immunization: Secondary | ICD-10-CM | POA: Diagnosis not present

## 2018-05-23 DIAGNOSIS — Z7189 Other specified counseling: Secondary | ICD-10-CM

## 2018-05-23 MED ORDER — OLMESARTAN MEDOXOMIL-HCTZ 40-25 MG PO TABS: 0.5000 | ORAL_TABLET | Freq: Every day | ORAL | 3 refills | Status: DC

## 2018-05-23 MED ORDER — METFORMIN HCL 500 MG PO TABS: ORAL_TABLET | ORAL | 3 refills | Status: DC

## 2018-05-23 MED ORDER — FLUTICASONE PROPIONATE 50 MCG/ACT NA SUSP: NASAL | 3 refills | Status: DC

## 2018-05-23 NOTE — Progress Notes (Signed)
I have personally reviewed the Medicare Annual Wellness questionnaire and have noted 1. The patient's medical and social history 2. Their use of alcohol, tobacco or illicit drugs 3. Their current medications and supplements 4. The patient's functional ability including ADL's, fall risks, home safety risks and hearing or visual             impairment. 5. Diet and physical activities 6. Evidence for depression or mood disorders  The patients weight, height, BMI have been recorded in the chart and visual acuity is per eye clinic.  I have made referrals, counseling and provided education to the patient based review of the above and I have provided the pt with a written personalized care plan for preventive services.  Provider list updated- see scanned forms.  Routine anticipatory guidance given to patient.  See health maintenance. The possibility exists that previously documented standard health maintenance information may have been brought forward from a previous encounter into this note.  If needed, that same information has been updated to reflect the current situation based on today's encounter.    Flu to be done in the fall, d/w pt Shingles 2014, d/w pt PNA 2019 Tetanus 2014 Colonoscopy 2012 Breast cancer screening 2018 DXA 2018 Advance directive-husband designated if patient were incapacitated Pap per gyn, I'll defer, d/w pt.  She agrees.  Cognitive function addressed- see scanned forms- and if abnormal then additional documentation follows.   Her daughter was dx'd with breast cancer, s/p treatment.  D/w pt.    Diabetes:  No meds.   Hypoglycemic episodes: no Hyperglycemic episodes: >300 recently. Feet problems: no Blood Sugars averaging: >300 recently.   eye exam within last year: yes A1c elevated, d/w pt.  Hypertension:    Using medication without problems or lightheadedness: yes Chest pain with exertion:no Edema: occ BLE in the summer Short of breath:no Labs d/w pt.    PMH and SH reviewed  Meds, vitals, and allergies reviewed.   ROS: Per HPI.  Unless specifically indicated otherwise in HPI, the patient denies:  General: fever. Eyes: acute vision changes ENT: sore throat Cardiovascular: chest pain Respiratory: SOB GI: vomiting GU: dysuria Musculoskeletal: acute back pain Derm: acute rash Neuro: acute motor dysfunction Psych: worsening mood Endocrine: polydipsia Heme: bleeding Allergy: hayfever  GEN: nad, alert and oriented HEENT: mucous membranes moist NECK: supple w/o LA CV: rrr. PULM: ctab, no inc wob ABD: soft, +bs EXT: no edema SKIN: no acute rash  Diabetic foot exam: Normal inspection No skin breakdown No calluses  Normal DP pulses Normal sensation to light touch and monofilament Nails normal

## 2018-05-23 NOTE — Patient Instructions (Addendum)
Use the eat right diet.  Drink plenty of water and less sweat tea.   Walk for exercise.  Start metformin.  Start 1 a day.  Gradually work up to 2 tabs twice a day if tolerated.  Update me about your sugar in about 10 days.  Recheck in 3 months.  The only lab you need to have done for your next diabetic visit is an A1c.  We can do this with a fingerstick test at the office visit.  You do not need a lab visit ahead of time for this.  It does not matter if you are fasting when the lab is done.   Take care.  Glad to see you.

## 2018-05-26 DIAGNOSIS — Z7189 Other specified counseling: Secondary | ICD-10-CM | POA: Insufficient documentation

## 2018-05-26 NOTE — Assessment & Plan Note (Signed)
Flu to be done in the fall, d/w pt Shingles 2014, d/w pt PNA 2019 Tetanus 2014 Colonoscopy 2012 Breast cancer screening 2018 DXA 2018 Advance directive-husband designated if patient were incapacitated Pap per gyn, I'll defer, d/w pt.  She agrees.  Cognitive function addressed- see scanned forms- and if abnormal then additional documentation follows.

## 2018-05-26 NOTE — Assessment & Plan Note (Signed)
Advance directive- husband designated if patient were incapacitated.  

## 2018-05-26 NOTE — Assessment & Plan Note (Addendum)
A1c up, d/w pt. needs work on diet and exercise.  Start metformin.  Gradually increase up to 1000 mg twice a day if tolerated.  Routine cautions given.  She will check to see which meter is covered by her insurance and she will let me know so we can send in a prescription.  She will start checking her sugar routinely in the meantime.  Plan on recheck as described with AVS. She agrees.   Low carbohydrate diet handout discussed with patient.

## 2018-05-26 NOTE — Assessment & Plan Note (Signed)
Reasonable control for now.  Needs work on diet and exercise.  No change in meds.  Labs discussed with patient.  She agrees.

## 2018-06-05 ENCOUNTER — Telehealth: Payer: Self-pay | Admitting: Family Medicine

## 2018-06-05 NOTE — Telephone Encounter (Signed)
Copied from [facility] [id number]. Topic: Quick Communication - See Telephone Encounter >> Jun 05, 2018  1:37 PM Vernona Rieger wrote: CRM for notification. See Telephone encounter for: 06/05/18.  Patient called and said that Dr Damita Dunnings was going to order her a glucose monitor. She checked with her insurance and they will cover the one touch ultra. [place] is at the beach right now. She wants to know can that be called in down there, she will be down there for 2 weeks. She wants to be called when that is called in. Pharmacy called [facility] phone number [phone].

## 2018-06-06 ENCOUNTER — Other Ambulatory Visit: Payer: Self-pay | Admitting: *Deleted

## 2018-06-06 MED ORDER — ONETOUCH ULTRA 2 W/DEVICE KIT: PACK | 0 refills | Status: DC

## 2018-06-06 NOTE — Telephone Encounter (Signed)
Glucometer kit ordered to [facility], [place], [place] as requested by patient and patient advised.

## 2018-07-04 DIAGNOSIS — Z800 Family history of malignant neoplasm of digestive organs: Secondary | ICD-10-CM | POA: Diagnosis not present

## 2018-07-04 DIAGNOSIS — Z6839 Body mass index (BMI) 39.0-39.9, adult: Secondary | ICD-10-CM | POA: Diagnosis not present

## 2018-07-04 DIAGNOSIS — Z779 Other contact with and (suspected) exposures hazardous to health: Secondary | ICD-10-CM | POA: Diagnosis not present

## 2018-07-04 DIAGNOSIS — Z853 Personal history of malignant neoplasm of breast: Secondary | ICD-10-CM | POA: Diagnosis not present

## 2018-07-04 DIAGNOSIS — Z803 Family history of malignant neoplasm of breast: Secondary | ICD-10-CM | POA: Diagnosis not present

## 2018-07-04 DIAGNOSIS — Z801 Family history of malignant neoplasm of trachea, bronchus and lung: Secondary | ICD-10-CM | POA: Diagnosis not present

## 2018-07-05 DIAGNOSIS — Z853 Personal history of malignant neoplasm of breast: Secondary | ICD-10-CM | POA: Diagnosis not present

## 2018-07-05 DIAGNOSIS — Z1231 Encounter for screening mammogram for malignant neoplasm of breast: Secondary | ICD-10-CM | POA: Diagnosis not present

## 2018-07-05 LAB — HM MAMMOGRAPHY

## 2018-07-09 ENCOUNTER — Encounter: Payer: Self-pay | Admitting: Family Medicine

## 2018-07-10 ENCOUNTER — Ambulatory Visit (INDEPENDENT_AMBULATORY_CARE_PROVIDER_SITE_OTHER): Payer: PPO | Admitting: Family Medicine

## 2018-07-10 ENCOUNTER — Encounter: Payer: Self-pay | Admitting: Family Medicine

## 2018-07-10 VITALS — BP 138/76 | HR 87 | Temp 97.7°F | Ht 60.75 in | Wt 209.0 lb

## 2018-07-10 DIAGNOSIS — L089 Local infection of the skin and subcutaneous tissue, unspecified: Secondary | ICD-10-CM

## 2018-07-10 MED ORDER — MUPIROCIN 2 % EX OINT: 1.0000 "application " | TOPICAL_OINTMENT | Freq: Three times a day (TID) | CUTANEOUS | 1 refills | Status: DC

## 2018-07-10 MED ORDER — CEPHALEXIN 500 MG PO CAPS: 500.0000 mg | ORAL_CAPSULE | Freq: Three times a day (TID) | ORAL | 0 refills | Status: DC

## 2018-07-10 NOTE — Patient Instructions (Signed)
Do warm compresses 3-4 times a day for 10-15 minutes  If not better with mupirocin ointment in a couple of days or if worse (fever, drainage, redness) start oral antibiotic

## 2018-07-10 NOTE — Progress Notes (Signed)
   Subjective:    Patient ID: Anita Cooper, female    DOB: [birth year], 67 y.o.   MRN: [id number]  HPI This is a 67 yo female who presents today with a spot on right calf x about 3 weeks. Seemed like a mosquito bite, scabbed over and last week got a little bigger with a white head. Gynecologist saw and told her to use neosporyn which she has been applying daily. Not getting any worse, staying the same. No other lesions, no fever, feels otherwise well. She is leaving for the beach today.   Past Medical History:  Diagnosis Date  . Arthritis   . Cancer [facility]) 2011 - Released by Dr. Jana Hakim 2012   stage 0 right breast S/P lumpectomy and sentinel node biopsy and radiation - per Dr. Donne Hazel.  . Carpal tunnel syndrome   . Diabetes mellitus without complication ([place])   . Elevated liver function tests    Mild increase due to fatty liver seen on [place], normal after weight loss  . GERD (gastroesophageal reflux disease)    Lack of relief with Prilosec and other OTC PPI's  . Hyperlipidemia   . Hypertension    Benign   Past Surgical History:  Procedure Laterality Date  . BREAST LUMPECTOMY  2011   right lumpectomy, sentinel node biopsy  . KNEE ARTHROSCOPY     Bilaterally   Family History  Problem Relation Age of Onset  . Cancer Father        Lung  . Breast cancer Daughter   . Colon cancer Neg Hx    Social History   Tobacco Use  . Smoking status: Never Smoker  . Smokeless tobacco: Never Used  Substance Use Topics  . Alcohol use: No    Alcohol/week: 0.0 standard drinks  . Drug use: No      Review of Systems Per HPI    Objective:   Physical Exam  Constitutional: She appears well-developed and well-nourished. No distress.  HENT:  Head: Normocephalic and atraumatic.  Eyes: Conjunctivae are normal.  Cardiovascular: Normal rate.  Pulmonary/Chest: Effort normal.  Skin: Skin is warm and dry. She is not diaphoretic.  Right lateral calf with approximately 1 cm raised, firm area of  mild erythema with central pustule. No fluctuance. Unable to express with gentle palpation. No streaking, no warmth.   Vitals reviewed.     BP 138/76 (BP Location: Right Arm, Patient Position: Sitting, Cuff Size: Normal)   Pulse 87   Temp 97.7 F (36.5 C) (Oral)   Ht 5' 0.75" (1.543 m)   Wt 209 lb (94.8 kg)   SpO2 98%   BMI 39.82 kg/m  Wt Readings from Last 3 Encounters:  07/10/18 209 lb (94.8 kg)  05/23/18 208 lb 8 oz (94.6 kg)  03/12/18 210 lb (95.3 kg)       Assessment & Plan:  1. Skin pustule - advised to apply warm compresses several times daily and mupirocin ointment, if worsening start cephalexin (printed prescription provided) - RTC precautions reviewed - mupirocin ointment (BACTROBAN) 2 %; Apply 1 application topically 3 (three) times daily.  Dispense: 22 g; Refill: 1 - cephALEXin (KEFLEX) 500 MG capsule; Take 1 capsule (500 mg total) by mouth 3 (three) times daily.  Dispense: 21 capsule; Refill: 0   Clarene Reamer, FNP-BC  [facility]  07/11/2018 7:09 AM

## 2018-07-11 ENCOUNTER — Ambulatory Visit: Payer: PPO | Admitting: Internal Medicine

## 2018-09-05 ENCOUNTER — Encounter: Payer: Self-pay | Admitting: Family Medicine

## 2018-09-05 ENCOUNTER — Ambulatory Visit (INDEPENDENT_AMBULATORY_CARE_PROVIDER_SITE_OTHER): Payer: PPO | Admitting: Family Medicine

## 2018-09-05 VITALS — BP 126/76 | HR 91 | Temp 97.9°F | Ht 60.75 in | Wt 207.8 lb

## 2018-09-05 DIAGNOSIS — E119 Type 2 diabetes mellitus without complications: Secondary | ICD-10-CM | POA: Diagnosis not present

## 2018-09-05 LAB — POCT GLYCOSYLATED HEMOGLOBIN (HGB A1C): HEMOGLOBIN A1C: 7.5 % — AB (ref 4.0–5.6)

## 2018-09-05 NOTE — Progress Notes (Signed)
Diabetes:  Using medications without difficulties:yes, taking 1g metformin BID Hypoglycemic episodes:no Hyperglycemic episodes:no Feet problems:no Blood Sugars averaging: usually ~ 140s eye exam within last year: f/u pending A1c sig better.   She is working on diet, cutting back on sweet tea.  She is getting some exercise.  She walked 50+ miles on vacation at [place] last week.   She feels better overall.    Flu shot to be done at pharmacy.  D/w pt.   Meds, vitals, and allergies reviewed.   ROS: Per HPI unless specifically indicated in ROS section   GEN: nad, alert and oriented NECK: supple w/o LA CV: rrr. PULM: ctab, no inc wob ABD: soft, +bs EXT: no edema SKIN: no acute rash  Diabetic foot exam: Normal inspection except for warty lesion on the L medial posterior foot.  Frozen x 3 with liq N2.   No skin breakdown No calluses  Normal DP pulses Normal sensation to light touch and monofilament Nails normal

## 2018-09-05 NOTE — Patient Instructions (Signed)
The spot should blister and then heal over.  Keep it covered as needed.  Thanks for your effort.   Recheck in about 3 months.  The only lab you need to have done for your next diabetic visit is an A1c.  We can do this with a fingerstick test at the office visit.  You do not need a lab visit ahead of time for this.  It does not matter if you are fasting when the lab is done.   If you have lows, then cut back on the metformin (2+1 or 1+1) and let me know.   Take care.  Glad to see you.

## 2018-09-08 NOTE — Assessment & Plan Note (Signed)
A1c improved.  Continue work on diet and exercise.  No change in meds at this point.  If she has any low sugars then she can cut back on metformin.  She understood the plan.  See after visit summary.  Discussed routine foot care, including treatment and follow-up care for warty lesion that was frozen without complication.

## 2018-10-02 DIAGNOSIS — H5213 Myopia, bilateral: Secondary | ICD-10-CM | POA: Diagnosis not present

## 2018-10-02 DIAGNOSIS — E119 Type 2 diabetes mellitus without complications: Secondary | ICD-10-CM | POA: Diagnosis not present

## 2018-10-02 DIAGNOSIS — H52223 Regular astigmatism, bilateral: Secondary | ICD-10-CM | POA: Diagnosis not present

## 2018-10-02 LAB — HM DIABETES EYE EXAM

## 2018-12-05 ENCOUNTER — Ambulatory Visit: Payer: PPO | Admitting: Family Medicine

## 2019-01-07 ENCOUNTER — Ambulatory Visit (INDEPENDENT_AMBULATORY_CARE_PROVIDER_SITE_OTHER): Payer: PPO | Admitting: Family Medicine

## 2019-01-07 ENCOUNTER — Other Ambulatory Visit: Payer: Self-pay | Admitting: Family Medicine

## 2019-01-07 ENCOUNTER — Encounter: Payer: Self-pay | Admitting: Family Medicine

## 2019-01-07 VITALS — BP 128/70 | HR 88 | Temp 97.9°F | Ht 60.75 in | Wt 207.1 lb

## 2019-01-07 DIAGNOSIS — E119 Type 2 diabetes mellitus without complications: Secondary | ICD-10-CM | POA: Diagnosis not present

## 2019-01-07 LAB — POCT GLYCOSYLATED HEMOGLOBIN (HGB A1C): HEMOGLOBIN A1C: 7.5 % — AB (ref 4.0–5.6)

## 2019-01-07 MED ORDER — METFORMIN HCL ER (OSM) 1000 MG PO TB24: 2000.0000 mg | ORAL_TABLET | Freq: Every day | ORAL | 3 refills | Status: DC

## 2019-01-07 NOTE — Patient Instructions (Signed)
Schedule a yearly visit after 05/24/2019.  If you have sugar trouble in the meantime, then let me know.  Priced check and try the long acting metformin.  Keep working on diet and exercise.  Take care.  Glad to see you.  Thanks for getting a flu shot.

## 2019-01-07 NOTE — Progress Notes (Signed)
Diabetes:  Using medications without difficulties: some missed doses in the PM, o/w no trouble with metformin.  D/w pt about options with adherence vs XR med.  Hypoglycemic episodes: no Hyperglycemic episodes: no Feet problems: no Blood Sugars averaging: usually 120-180, usually on the lower end of that range  eye exam within last year: yes, [facility] 10/2018 A1c stable at 7.5   Diet is fair, she needs to work more on exercise.  D/w pt.  She signed up for exercise program at wellness center at [facility].    She hasn't had shingrix yet, d/w pt.  She had a flu shot.   Meds, vitals, and allergies reviewed.  ROS: Per HPI unless specifically indicated in ROS section   GEN: nad, alert and oriented HEENT: mucous membranes moist NECK: supple w/o LA CV: rrr. PULM: ctab, no inc wob ABD: soft, +bs EXT: no edema SKIN: well perfused.   Diabetic foot exam: Normal inspection No skin breakdown No calluses  Normal DP pulses Normal sensation to light touch and monofilament Nails normal

## 2019-01-07 NOTE — Telephone Encounter (Signed)
metFORMIN (GLUCOPHAGE) 1000 MG tablet        Changed from: metformin (FORTAMET) 1000 MG (OSM) 24 hr tablet       Sig: TAKE 2 TABLETS BY MOUTH TWICE A DAY   Disp:  Not specified  Refills:  0   Start: 01/07/2019   Class: Normal   Last ordered: Today by Tonia Ghent, MD Last refill: 01/07/2019   Rx #: [id number]   Pharmacy comment: Alternative Requested:NOT COVERED BY INSURANCE, PLEASE CHANGE

## 2019-01-08 NOTE — Telephone Encounter (Signed)
Notify patient that the extended release is not covered.  I sent a new prescription for 1000 mg tablets but she will still have to take it twice a day.  Thanks.

## 2019-01-08 NOTE — Assessment & Plan Note (Signed)
A1c stable at 7.5   Diet is fair, she needs to work more on exercise.  D/w pt.  She signed up for exercise program at wellness center at [facility]   Discussed options to increase med adherence with metformin.  Recheck this summer.  Update me as needed in the meantime.  She agrees.

## 2019-01-10 NOTE — Telephone Encounter (Signed)
Spoke to pt

## 2019-01-10 NOTE — Telephone Encounter (Signed)
System shows that the script sent failed. Called and script verbally given to [facility] for Metformin.

## 2019-01-15 DIAGNOSIS — D224 Melanocytic nevi of scalp and neck: Secondary | ICD-10-CM | POA: Diagnosis not present

## 2019-01-15 DIAGNOSIS — B078 Other viral warts: Secondary | ICD-10-CM | POA: Diagnosis not present

## 2019-01-15 DIAGNOSIS — D485 Neoplasm of uncertain behavior of skin: Secondary | ICD-10-CM | POA: Diagnosis not present

## 2019-01-15 DIAGNOSIS — D235 Other benign neoplasm of skin of trunk: Secondary | ICD-10-CM | POA: Diagnosis not present

## 2019-01-15 DIAGNOSIS — D225 Melanocytic nevi of trunk: Secondary | ICD-10-CM | POA: Diagnosis not present

## 2019-01-15 DIAGNOSIS — L821 Other seborrheic keratosis: Secondary | ICD-10-CM | POA: Diagnosis not present

## 2019-01-15 DIAGNOSIS — D2261 Melanocytic nevi of right upper limb, including shoulder: Secondary | ICD-10-CM | POA: Diagnosis not present

## 2019-01-15 DIAGNOSIS — D1801 Hemangioma of skin and subcutaneous tissue: Secondary | ICD-10-CM | POA: Diagnosis not present

## 2019-01-15 DIAGNOSIS — D2262 Melanocytic nevi of left upper limb, including shoulder: Secondary | ICD-10-CM | POA: Diagnosis not present

## 2019-01-23 ENCOUNTER — Encounter: Payer: Self-pay | Admitting: Family Medicine

## 2019-02-07 ENCOUNTER — Encounter: Payer: Self-pay | Admitting: Family Medicine

## 2019-05-27 ENCOUNTER — Other Ambulatory Visit: Payer: Self-pay | Admitting: Family Medicine

## 2019-05-29 ENCOUNTER — Other Ambulatory Visit: Payer: Self-pay | Admitting: Family Medicine

## 2019-05-29 DIAGNOSIS — E119 Type 2 diabetes mellitus without complications: Secondary | ICD-10-CM

## 2019-05-30 ENCOUNTER — Ambulatory Visit: Payer: PPO

## 2019-05-30 ENCOUNTER — Other Ambulatory Visit: Payer: Self-pay

## 2019-05-30 ENCOUNTER — Other Ambulatory Visit (INDEPENDENT_AMBULATORY_CARE_PROVIDER_SITE_OTHER): Payer: PPO

## 2019-05-30 DIAGNOSIS — E119 Type 2 diabetes mellitus without complications: Secondary | ICD-10-CM

## 2019-05-30 LAB — COMPREHENSIVE METABOLIC PANEL
ALT: 50 U/L — ABNORMAL HIGH (ref 0–35)
AST: 35 U/L (ref 0–37)
Albumin: 4.2 g/dL (ref 3.5–5.2)
Alkaline Phosphatase: 43 U/L (ref 39–117)
BUN: 18 mg/dL (ref 6–23)
CO2: 33 mEq/L — ABNORMAL HIGH (ref 19–32)
Calcium: 9.8 mg/dL (ref 8.4–10.5)
Chloride: 96 mEq/L (ref 96–112)
Creatinine, Ser: 0.86 mg/dL (ref 0.40–1.20)
GFR: 65.81 mL/min (ref >60.00)
Glucose, Bld: 134 mg/dL — ABNORMAL HIGH (ref 70–99)
Potassium: 3.9 mEq/L (ref 3.5–5.1)
Sodium: 138 mEq/L (ref 135–145)
Total Bilirubin: 0.4 mg/dL (ref 0.2–1.2)
Total Protein: 6.8 g/dL (ref 6.0–8.3)

## 2019-05-30 LAB — LIPID PANEL
Cholesterol: 199 mg/dL (ref 0–200)
HDL: 44.2 mg/dL
LDL Cholesterol: 126 mg/dL — ABNORMAL HIGH (ref 0–99)
NonHDL: 154.34
Total CHOL/HDL Ratio: 4
Triglycerides: 144 mg/dL (ref 0.0–149.0)
VLDL: 28.8 mg/dL (ref 0.0–40.0)

## 2019-05-30 LAB — HEMOGLOBIN A1C: Hgb A1c MFr Bld: 7.2 % — ABNORMAL HIGH (ref 4.6–6.5)

## 2019-06-03 ENCOUNTER — Other Ambulatory Visit: Payer: Self-pay

## 2019-06-03 ENCOUNTER — Encounter: Payer: Self-pay | Admitting: Family Medicine

## 2019-06-03 ENCOUNTER — Ambulatory Visit (INDEPENDENT_AMBULATORY_CARE_PROVIDER_SITE_OTHER): Payer: PPO | Admitting: Family Medicine

## 2019-06-03 VITALS — BP 156/76 | HR 85 | Temp 98.1°F | Ht 60.75 in | Wt 202.0 lb

## 2019-06-03 DIAGNOSIS — E119 Type 2 diabetes mellitus without complications: Secondary | ICD-10-CM | POA: Diagnosis not present

## 2019-06-03 DIAGNOSIS — Z7189 Other specified counseling: Secondary | ICD-10-CM

## 2019-06-03 DIAGNOSIS — Z0000 Encounter for general adult medical examination without abnormal findings: Secondary | ICD-10-CM | POA: Diagnosis not present

## 2019-06-03 DIAGNOSIS — I10 Essential (primary) hypertension: Secondary | ICD-10-CM

## 2019-06-03 DIAGNOSIS — R945 Abnormal results of liver function studies: Secondary | ICD-10-CM | POA: Diagnosis not present

## 2019-06-03 DIAGNOSIS — R7989 Other specified abnormal findings of blood chemistry: Secondary | ICD-10-CM

## 2019-06-03 DIAGNOSIS — E7800 Pure hypercholesterolemia, unspecified: Secondary | ICD-10-CM

## 2019-06-03 NOTE — Progress Notes (Signed)
I have personally reviewed the Medicare Annual Wellness questionnaire and have noted 1. The patient's medical and social history 2. Their use of alcohol, tobacco or illicit drugs 3. Their current medications and supplements 4. The patient's functional ability including ADL's, fall risks, home safety risks and hearing or visual             impairment. 5. Diet and physical activities 6. Evidence for depression or mood disorders  The patients weight, height, BMI have been recorded in the chart and visual acuity is per eye clinic.  I have made referrals, counseling and provided education to the patient based review of the above and I have provided the pt with a written personalized care plan for preventive services.  Provider list updated- see scanned forms.  Routine anticipatory guidance given to patient.  See health maintenance. The possibility exists that previously documented standard health maintenance information may have been brought forward from a previous encounter into this note.  If needed, that same information has been updated to reflect the current situation based on today's encounter.    Flu to be done in the fall, d/w pt Shingles d/w pt.   PNA up-to-date Tetanus 2014 Colonoscopy 2012 Breast cancer screening follow-up pending. DXA 2018-  defer follow-up 2020 Advance directive-husband designated if patient were incapacitated Cognitive function addressed- see scanned forms- and if abnormal then additional documentation follows.   Diabetes:  Using medications without difficulties: yes Hypoglycemic episodes:no Hyperglycemic episodes:no Feet problems:no Blood Sugars averaging: 112-153, usually 110s-120 eye exam within last year:yes Labs d/w pt.    Hypertension:    Using medication without problems or lightheadedness: yes Chest pain with exertion:no Edema:occ mild and limited.  Short of breath:no Average home BPs:130/70s on home check.    Elevated Cholesterol: D/w pt  about considering pravastatin start.  LFT elevation noted, d/w pt, improved from previous.  No jaundice.  No nausea vomiting.  No abdominal pain.  She occ takes advil for hand arthritis. She is putting up with the discomfort otherwise.  PMH and SH reviewed  Meds, vitals, and allergies reviewed.   ROS: Per HPI.  Unless specifically indicated otherwise in HPI, the patient denies:  General: fever. Eyes: acute vision changes ENT: sore throat Cardiovascular: chest pain Respiratory: SOB GI: vomiting GU: dysuria Musculoskeletal: acute back pain Derm: acute rash Neuro: acute motor dysfunction Psych: worsening mood Endocrine: polydipsia Heme: bleeding Allergy: hayfever  GEN: nad, alert and oriented HEENT: mucous membranes moist NECK: supple w/o LA CV: rrr. PULM: ctab, no inc wob ABD: soft, +bs EXT: no edema SKIN: no acute rash  Diabetic foot exam: Normal inspection No skin breakdown No calluses  Normal DP pulses Normal sensation to light touch and monofilament Nails normal

## 2019-06-03 NOTE — Patient Instructions (Signed)
Recheck in about 3 months.   Labs at the visit, A1c and liver tests.  Update me as needed.  Consider pravastatin.  Take care.  Glad to see you.

## 2019-06-04 DIAGNOSIS — R7989 Other specified abnormal findings of blood chemistry: Secondary | ICD-10-CM | POA: Insufficient documentation

## 2019-06-04 IMAGING — US US EXTREM LOW VENOUS*L*
1 series · 13 of 24 positions shown · non-contrast
Comparison: None.

CLINICAL DATA: 66-year-old with left leg swelling.



[Series 1: us extrem low venous*left* · 0.08mm/px · 13 of 34 slices shown]
[im 1/34]
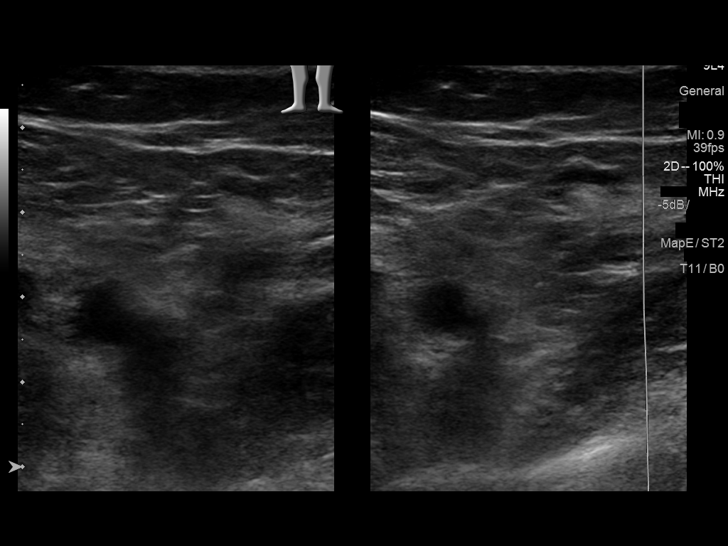
[im 3/34]
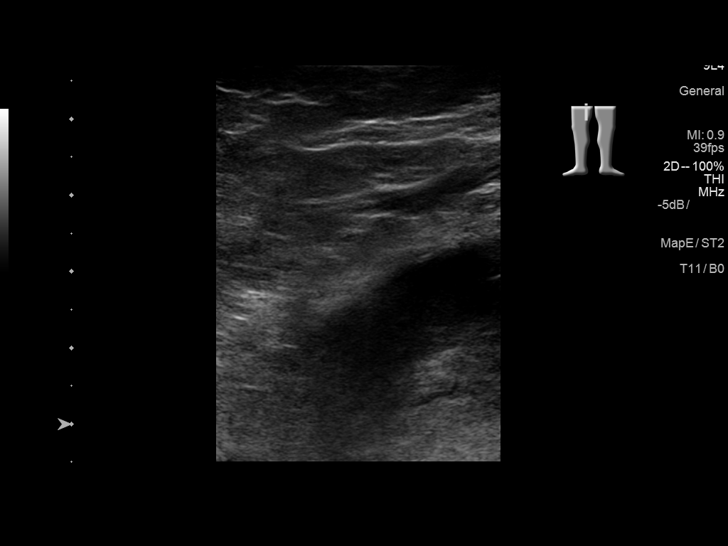
[im 6/34]
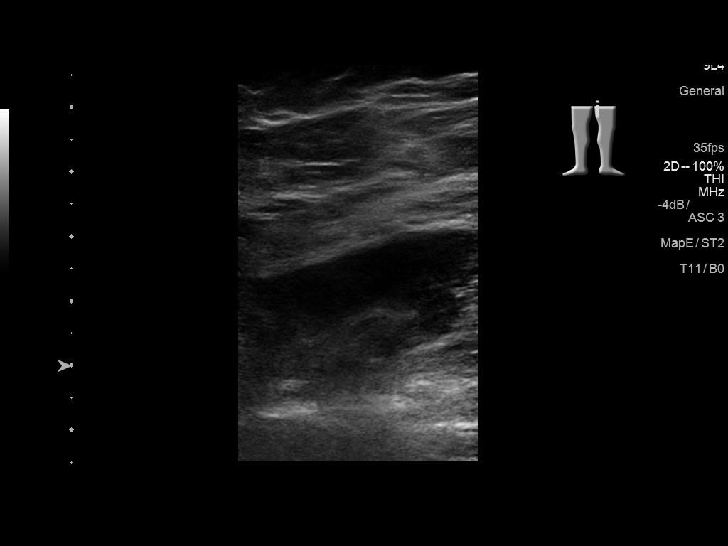
[im 9/34]
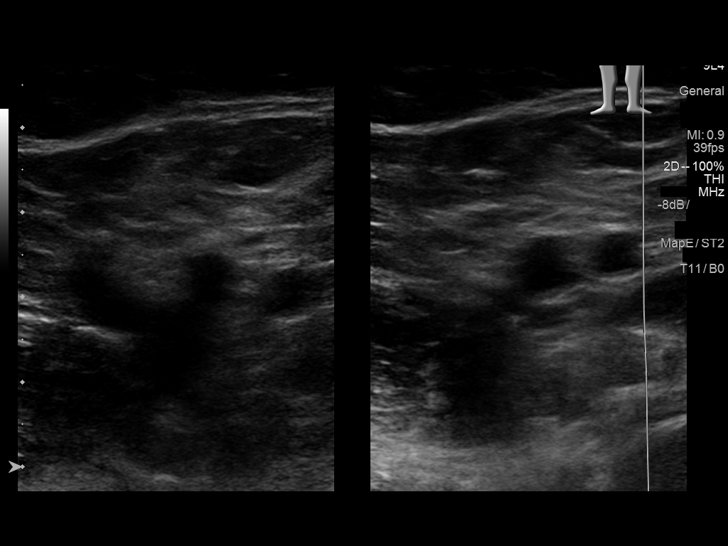
[im 12/34]
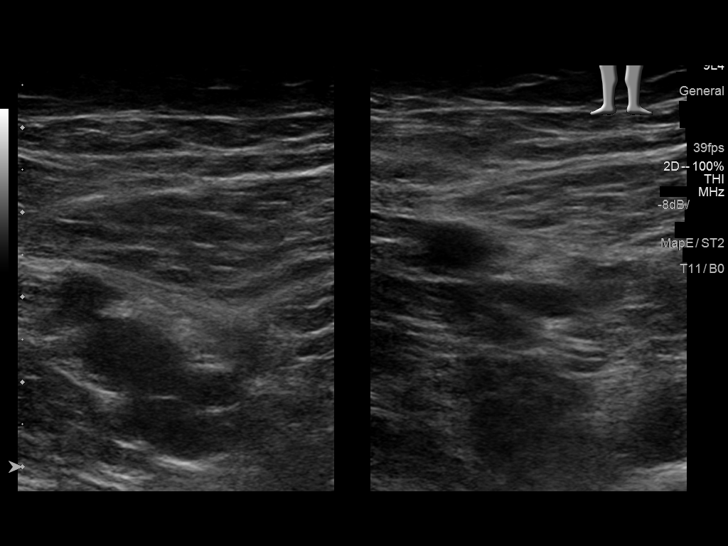
[im 15/34]
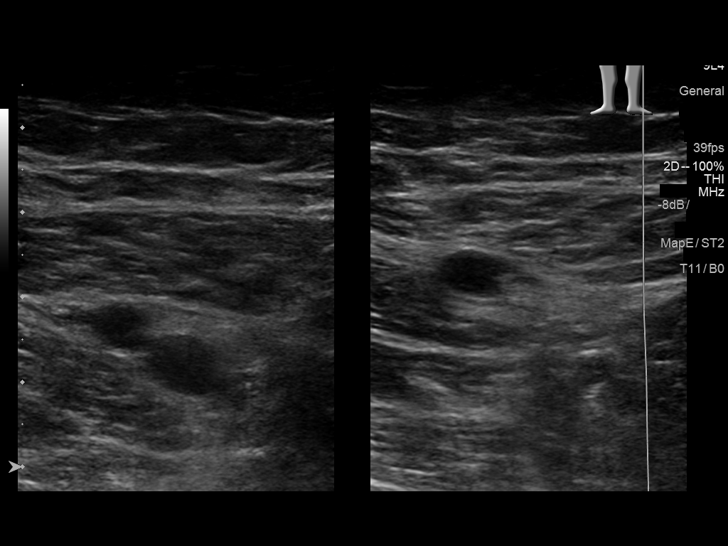
[im 18/34]
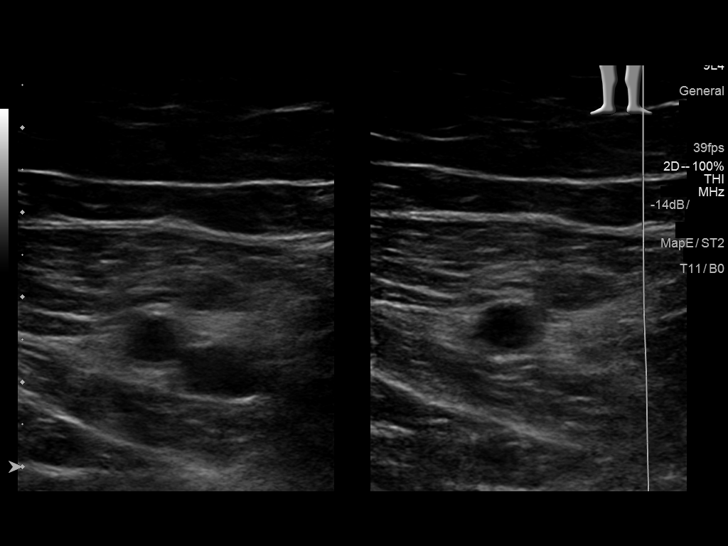
[im 19/34]
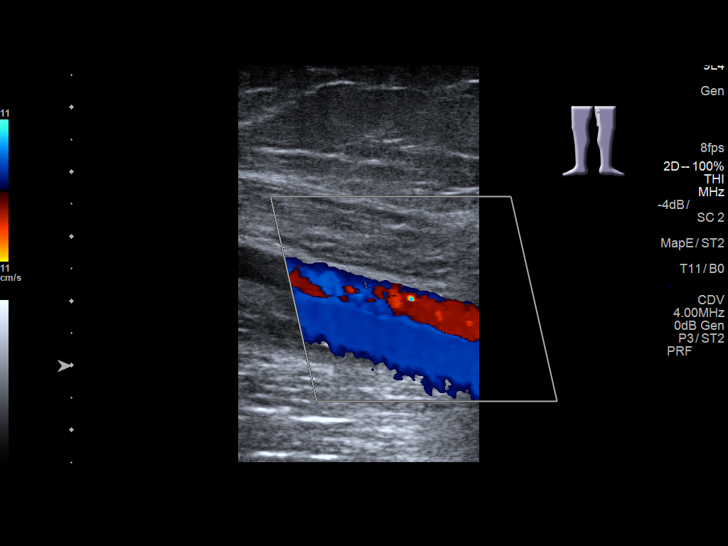
[im 22/34]
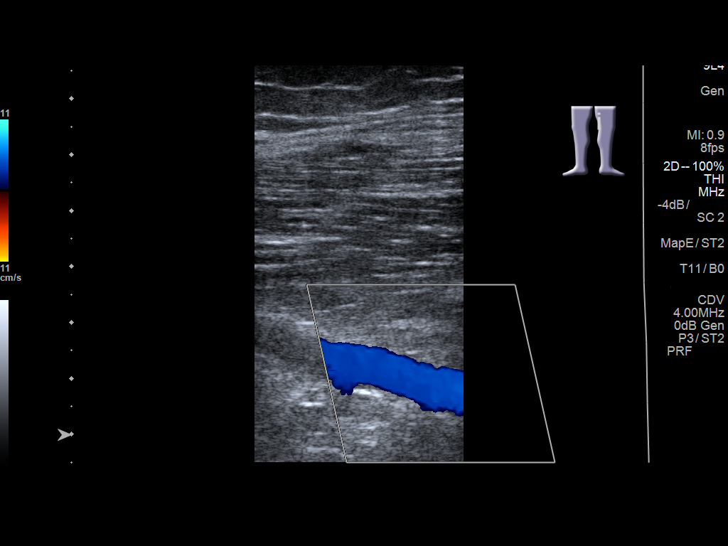
[im 25/34]
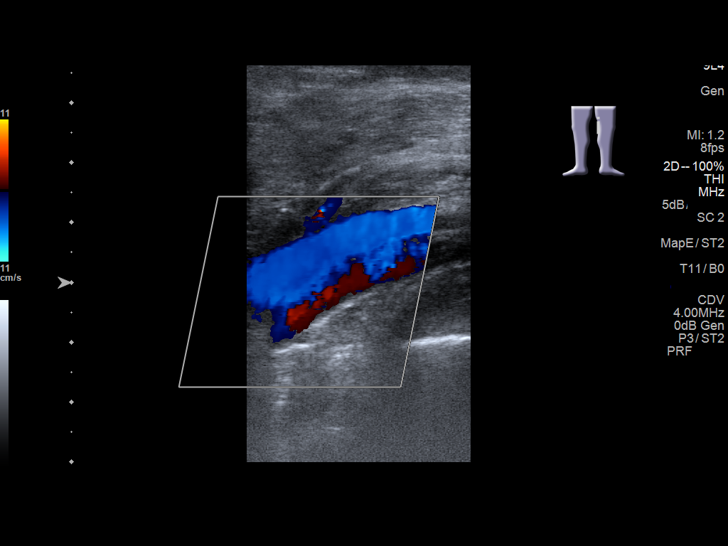
[im 28/34]
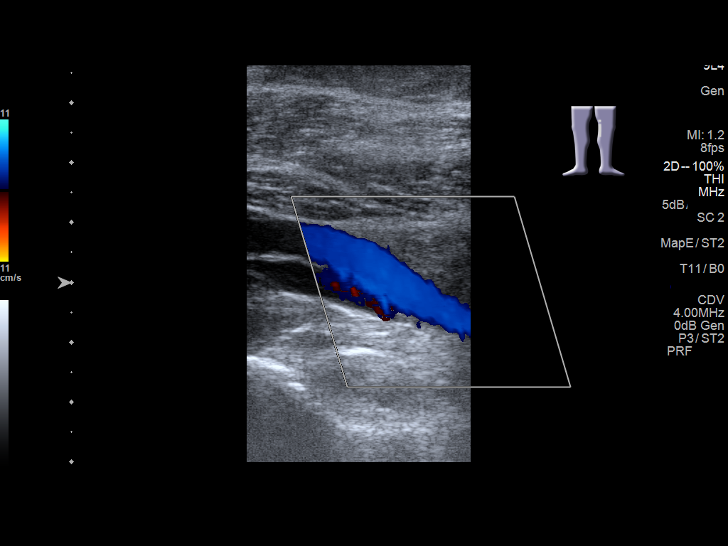
[im 31/34]
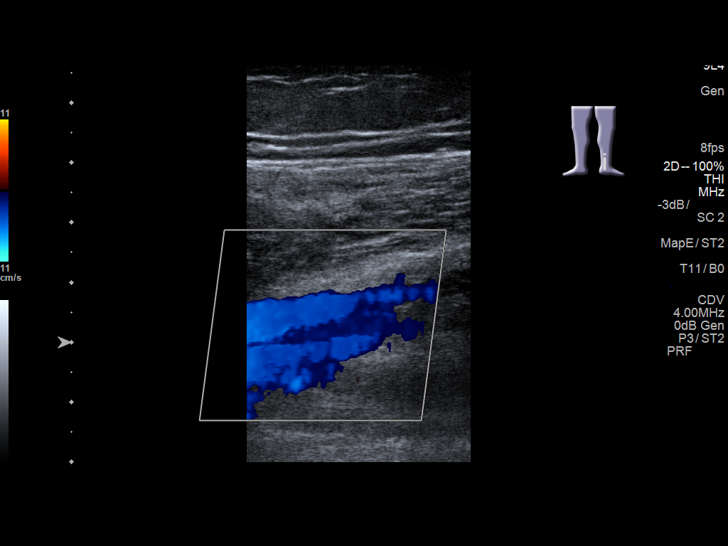
[im 34/34]
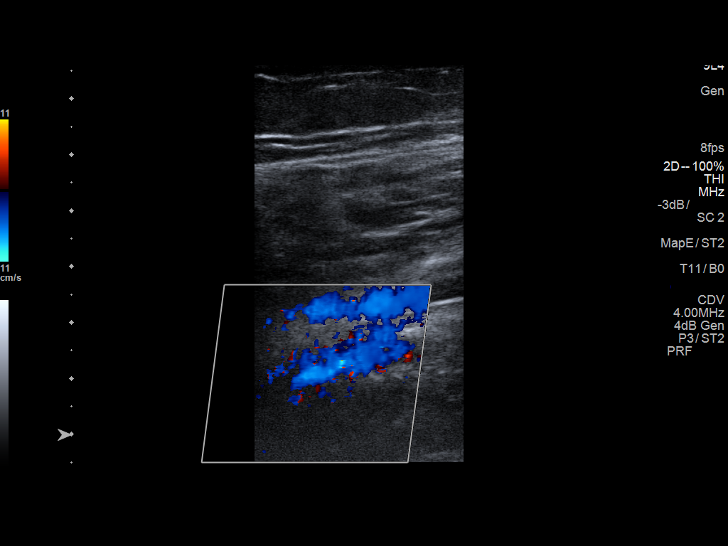

[13 of 24 positions shown; findings below may reference images not displayed]

FINDINGS: Contralateral Common Femoral Vein: Respiratory phasicity is normal
and symmetric with the symptomatic side. No evidence of thrombus.
Normal compressibility.

Common Femoral Vein: No evidence of thrombus. Normal
compressibility, respiratory phasicity and response to augmentation.

Saphenofemoral Junction: No evidence of thrombus. Normal
compressibility and flow on color Doppler imaging.

Profunda Femoral Vein: No evidence of thrombus. Normal
compressibility and flow on color Doppler imaging.

Femoral Vein: No evidence of thrombus. Normal compressibility,
respiratory phasicity and response to augmentation.

Popliteal Vein: No evidence of thrombus. Normal compressibility,
respiratory phasicity and response to augmentation.

Calf Veins: No evidence of thrombus. Normal compressibility and flow
on color Doppler imaging.

Other Findings:  None.
IMPRESSION: Negative for deep venous thrombosis in left lower extremity.

## 2019-06-04 NOTE — Assessment & Plan Note (Signed)
Flu to be done in the fall, d/w pt Shingles d/w pt.   PNA up-to-date Tetanus 2014 Colonoscopy 2012 Breast cancer screening follow-up pending. DXA 2018-  defer follow-up 2020  Advance directive-husband designated if patient were incapacitated Cognitive function addressed- see scanned forms- and if abnormal then additional documentation follows.

## 2019-06-04 NOTE — Assessment & Plan Note (Signed)
Advance directive- husband designated if patient were incapacitated.  

## 2019-06-04 NOTE — Assessment & Plan Note (Signed)
Continue work on diet and exercise.  No change in meds at this point Recheck in about 3 months.   Labs at the visit, A1c and liver tests.  Update me as needed.  Consider pravastatin.  Discussed with patient.

## 2019-06-04 NOTE — Assessment & Plan Note (Signed)
No change in meds.  She can check blood pressure at home and update me as needed.  She can calibrate her cuff at next visit.

## 2019-06-04 NOTE — Assessment & Plan Note (Signed)
Likely from fatty liver.  LFTs improved as A1c improved.  Recheck in a few months.  She agrees.  Continue work on diet and exercise.

## 2019-06-04 NOTE — Assessment & Plan Note (Signed)
Continue work on diet and exercise peer Recheck in about 3 months.   Labs at the visit, A1c and liver tests.  Update me as needed.  Consider pravastatin.

## 2019-07-24 ENCOUNTER — Encounter: Payer: Self-pay | Admitting: Family Medicine

## 2019-07-24 DIAGNOSIS — Z853 Personal history of malignant neoplasm of breast: Secondary | ICD-10-CM | POA: Diagnosis not present

## 2019-07-24 DIAGNOSIS — Z1231 Encounter for screening mammogram for malignant neoplasm of breast: Secondary | ICD-10-CM | POA: Diagnosis not present

## 2019-07-24 LAB — HM MAMMOGRAPHY

## 2019-07-28 ENCOUNTER — Telehealth: Payer: Self-pay

## 2019-07-28 NOTE — Telephone Encounter (Signed)
Patient returned Brock's call and I notified her that her mammogram was normal.

## 2019-07-28 NOTE — Telephone Encounter (Signed)
Left message for patient to return call back. Mammogram is normal

## 2019-07-30 DIAGNOSIS — M79641 Pain in right hand: Secondary | ICD-10-CM | POA: Insufficient documentation

## 2019-07-30 DIAGNOSIS — M1811 Unilateral primary osteoarthritis of first carpometacarpal joint, right hand: Secondary | ICD-10-CM | POA: Diagnosis not present

## 2019-07-30 DIAGNOSIS — M79645 Pain in left finger(s): Secondary | ICD-10-CM | POA: Diagnosis not present

## 2019-07-30 DIAGNOSIS — M180 Bilateral primary osteoarthritis of first carpometacarpal joints: Secondary | ICD-10-CM | POA: Diagnosis not present

## 2019-07-30 DIAGNOSIS — M1812 Unilateral primary osteoarthritis of first carpometacarpal joint, left hand: Secondary | ICD-10-CM | POA: Diagnosis not present

## 2019-07-30 DIAGNOSIS — M79644 Pain in right finger(s): Secondary | ICD-10-CM | POA: Diagnosis not present

## 2019-08-15 ENCOUNTER — Encounter: Payer: Self-pay | Admitting: Radiology

## 2019-08-19 DIAGNOSIS — Z6838 Body mass index (BMI) 38.0-38.9, adult: Secondary | ICD-10-CM | POA: Diagnosis not present

## 2019-08-19 DIAGNOSIS — M1990 Unspecified osteoarthritis, unspecified site: Secondary | ICD-10-CM | POA: Insufficient documentation

## 2019-08-19 DIAGNOSIS — Z01419 Encounter for gynecological examination (general) (routine) without abnormal findings: Secondary | ICD-10-CM | POA: Diagnosis not present

## 2019-08-22 ENCOUNTER — Encounter: Payer: Self-pay | Admitting: Family Medicine

## 2019-09-05 ENCOUNTER — Ambulatory Visit (INDEPENDENT_AMBULATORY_CARE_PROVIDER_SITE_OTHER): Payer: PPO | Admitting: Family Medicine

## 2019-09-05 ENCOUNTER — Other Ambulatory Visit: Payer: Self-pay

## 2019-09-05 ENCOUNTER — Encounter: Payer: Self-pay | Admitting: Family Medicine

## 2019-09-05 VITALS — BP 138/68 | HR 102 | Temp 97.6°F | Ht 60.75 in | Wt 203.1 lb

## 2019-09-05 DIAGNOSIS — K219 Gastro-esophageal reflux disease without esophagitis: Secondary | ICD-10-CM

## 2019-09-05 DIAGNOSIS — R7989 Other specified abnormal findings of blood chemistry: Secondary | ICD-10-CM

## 2019-09-05 DIAGNOSIS — E119 Type 2 diabetes mellitus without complications: Secondary | ICD-10-CM | POA: Diagnosis not present

## 2019-09-05 LAB — POCT GLYCOSYLATED HEMOGLOBIN (HGB A1C): Hemoglobin A1C: 6.8 % — AB (ref 4.0–5.6)

## 2019-09-05 MED ORDER — ESOMEPRAZOLE MAGNESIUM 40 MG PO CPDR: 40.0000 mg | DELAYED_RELEASE_CAPSULE | Freq: Every day | ORAL | 3 refills | Status: DC

## 2019-09-05 NOTE — Addendum Note (Signed)
Addended by: Ellamae Sia on: 09/05/2019 02:32 PM   Modules accepted: Orders

## 2019-09-05 NOTE — Patient Instructions (Addendum)
If you have any troubles with low sugars then cut back to 1 metformin twice a a day.   Go to the lab on the way out.  We'll contact you with your lab report (liver tests). Recheck in 3-6 months with A1c at the visit.   If your sugar is fine, then plan on 6 months.   Take care.  Glad to see you.

## 2019-09-05 NOTE — Progress Notes (Signed)
Diabetes:  Using medications without difficulties: taking metformin 1000mg  BID.  No ADE on med.   Hypoglycemic episodes:none known, but cautions d/w pt.  Hyperglycemic episodes: no Feet problems: no Blood Sugars averaging: ~12-150s  eye exam within last year: yes A1c improved.  D/w pt.    She needed a refill on PPI, taking nexium daily.  Failed taper off med. D/w pt.    Due for follow-up LFTs.  See notes on labs.  She has a history of known fatty liver disease.  Meds, vitals, and allergies reviewed.   ROS: Per HPI unless specifically indicated in ROS section   GEN: nad, alert and oriented HEENT: ncat NECK: supple w/o LA CV: rrr. PULM: ctab, no inc wob ABD: soft, +bs EXT: no edema SKIN: no acute rash  Diabetic foot exam: Normal inspection No skin breakdown No calluses  Normal DP pulses Normal sensation to light touch and monofilament Nails normal

## 2019-09-06 LAB — HEPATIC FUNCTION PANEL
AG Ratio: 1.4 (calc) (ref 1.0–2.5)
ALT: 49 U/L — ABNORMAL HIGH (ref 6–29)
AST: 45 U/L — ABNORMAL HIGH (ref 10–35)
Albumin: 4.3 g/dL (ref 3.6–5.1)
Alkaline phosphatase (APISO): 42 U/L (ref 37–153)
Bilirubin, Direct: 0.1 mg/dL (ref 0.0–0.2)
Globulin: 3 g/dL (calc) (ref 1.9–3.7)
Indirect Bilirubin: 0.4 mg/dL (calc) (ref 0.2–1.2)
Total Bilirubin: 0.5 mg/dL (ref 0.2–1.2)
Total Protein: 7.3 g/dL (ref 6.1–8.1)

## 2019-09-07 NOTE — Assessment & Plan Note (Signed)
Sent refill on PPI, taking nexium daily.  Failed taper off med. D/w pt.

## 2019-09-07 NOTE — Assessment & Plan Note (Signed)
Due for follow-up LFTs.  See notes on labs.  She has a history of known fatty liver disease.

## 2019-09-07 NOTE — Assessment & Plan Note (Signed)
A1c improved.  D/w pt.   If any troubles with low sugars then cut back to 1 metformin twice a a day.   Recheck in 3-6 months with A1c at the visit.   If sugar is fine, then plan on 6 months.  She agrees.  Continue work on diet and exercise.

## 2019-11-28 ENCOUNTER — Other Ambulatory Visit: Payer: Self-pay | Admitting: Family Medicine

## 2019-12-03 DIAGNOSIS — H5213 Myopia, bilateral: Secondary | ICD-10-CM | POA: Diagnosis not present

## 2019-12-03 DIAGNOSIS — H524 Presbyopia: Secondary | ICD-10-CM | POA: Diagnosis not present

## 2019-12-03 DIAGNOSIS — H52223 Regular astigmatism, bilateral: Secondary | ICD-10-CM | POA: Diagnosis not present

## 2019-12-03 DIAGNOSIS — E119 Type 2 diabetes mellitus without complications: Secondary | ICD-10-CM | POA: Diagnosis not present

## 2019-12-03 DIAGNOSIS — H25813 Combined forms of age-related cataract, bilateral: Secondary | ICD-10-CM | POA: Diagnosis not present

## 2019-12-04 LAB — HM DIABETES EYE EXAM

## 2019-12-11 ENCOUNTER — Ambulatory Visit: Payer: PPO

## 2019-12-19 ENCOUNTER — Ambulatory Visit: Payer: PPO | Attending: Internal Medicine

## 2019-12-19 DIAGNOSIS — Z23 Encounter for immunization: Secondary | ICD-10-CM

## 2019-12-19 NOTE — Progress Notes (Signed)
   Covid-19 Vaccination Clinic  Name:  Anita Cooper    MRN: [id number] DOB: [birth year]  12/19/2019  [name] was observed post Covid-19 immunization for 15 minutes without incidence. She was provided with Vaccine Information Sheet and instruction to access the V-Safe system.   [name] was instructed to call 911 with any severe reactions post vaccine: Marland Kitchen Difficulty breathing  . Swelling of your face and throat  . A fast heartbeat  . A bad rash all over your body  . Dizziness and weakness    Immunizations Administered    Name Date Dose VIS Date Route   Pfizer COVID-19 Vaccine 12/19/2019  5:37 PM 0.3 mL 10/24/2019 Intramuscular   Manufacturer: [place]   Lot: [id number]   [place]: [id number]

## 2019-12-28 ENCOUNTER — Ambulatory Visit: Payer: PPO

## 2019-12-31 ENCOUNTER — Encounter: Payer: Self-pay | Admitting: Optometry

## 2020-01-13 ENCOUNTER — Ambulatory Visit: Payer: PPO | Attending: Internal Medicine

## 2020-01-13 ENCOUNTER — Ambulatory Visit: Payer: PPO

## 2020-01-13 DIAGNOSIS — Z23 Encounter for immunization: Secondary | ICD-10-CM | POA: Insufficient documentation

## 2020-01-13 NOTE — Progress Notes (Signed)
   Covid-19 Vaccination Clinic  Name:  Anita Cooper    MRN: [id number] DOB: November 03, 1951  01/13/2020  [name] was observed post Covid-19 immunization for 15 minutes without incident. She was provided with Vaccine Information Sheet and instruction to access the V-Safe system.   [name] was instructed to call 911 with any severe reactions post vaccine: Marland Kitchen Difficulty breathing  . Swelling of face and throat  . A fast heartbeat  . A bad rash all over body  . Dizziness and weakness   Immunizations Administered    Name Date Dose VIS Date Route   Pfizer COVID-19 Vaccine 01/13/2020 11:44 AM 0.3 mL 10/24/2019 Intramuscular   Manufacturer: [place]   Lot: [id number]   [place]: [id number]

## 2020-01-14 ENCOUNTER — Other Ambulatory Visit: Payer: Self-pay | Admitting: Family Medicine

## 2020-01-28 DIAGNOSIS — D2262 Melanocytic nevi of left upper limb, including shoulder: Secondary | ICD-10-CM | POA: Diagnosis not present

## 2020-01-28 DIAGNOSIS — L814 Other melanin hyperpigmentation: Secondary | ICD-10-CM | POA: Diagnosis not present

## 2020-01-28 DIAGNOSIS — D225 Melanocytic nevi of trunk: Secondary | ICD-10-CM | POA: Diagnosis not present

## 2020-01-28 DIAGNOSIS — L821 Other seborrheic keratosis: Secondary | ICD-10-CM | POA: Diagnosis not present

## 2020-01-28 DIAGNOSIS — D2261 Melanocytic nevi of right upper limb, including shoulder: Secondary | ICD-10-CM | POA: Diagnosis not present

## 2020-02-26 ENCOUNTER — Encounter: Payer: Self-pay | Admitting: Family Medicine

## 2020-02-26 ENCOUNTER — Other Ambulatory Visit: Payer: Self-pay

## 2020-02-26 ENCOUNTER — Ambulatory Visit (INDEPENDENT_AMBULATORY_CARE_PROVIDER_SITE_OTHER): Payer: PPO | Admitting: Family Medicine

## 2020-02-26 VITALS — BP 140/82 | HR 99 | Temp 97.0°F | Ht 60.75 in | Wt 206.1 lb

## 2020-02-26 DIAGNOSIS — E119 Type 2 diabetes mellitus without complications: Secondary | ICD-10-CM | POA: Diagnosis not present

## 2020-02-26 LAB — POCT GLYCOSYLATED HEMOGLOBIN (HGB A1C): Hemoglobin A1C: 7.2 % — AB (ref 4.0–5.6)

## 2020-02-26 NOTE — Patient Instructions (Addendum)
Keep working on diet and exercise and recheck in about 6 months at a yearly visit/physical.  Update me as needed in the meantime.  Take care.  Glad to see you.

## 2020-02-26 NOTE — Progress Notes (Signed)
This visit occurred during the SARS-CoV-2 public health emergency.  Safety protocols were in place, including screening questions prior to the visit, additional usage of staff PPE, and extensive cleaning of exam room while observing appropriate contact time as indicated for disinfecting solutions.  Diabetes:  Using medications without difficulties: yes Hypoglycemic episodes: rare, cautions d/w pt.  Only with prolonged fasting.   Hyperglycemic episodes: no Feet problems: no Blood Sugars averaging: not checked often.   eye exam within last year: yes A1c 7.2.  Taking 103m BID metformin.    She had her covid vaccine. D/w pt.    Meds, vitals, and allergies reviewed.  ROS: Per HPI unless specifically indicated in ROS section   GEN: nad, alert and oriented HEENT: ncat NECK: supple w/o LA CV: rrr. PULM: ctab, no inc wob ABD: soft, +bs EXT: no edema SKIN: no acute rash

## 2020-02-27 ENCOUNTER — Ambulatory Visit: Payer: PPO | Admitting: Family Medicine

## 2020-02-28 NOTE — Assessment & Plan Note (Signed)
Discussed options.  Reasonable to keep working on diet and exercise and recheck in about 6 months at a yearly visit/physical.  Update me as needed in the meantime.  She agrees.  She is hopeful that her A1c can improve with continued work on diet and exercise.  I think this is reasonable.

## 2020-04-18 ENCOUNTER — Other Ambulatory Visit: Payer: Self-pay | Admitting: Family Medicine

## 2020-05-19 ENCOUNTER — Other Ambulatory Visit: Payer: Self-pay | Admitting: Family Medicine

## 2020-05-26 ENCOUNTER — Other Ambulatory Visit: Payer: Self-pay | Admitting: Family Medicine

## 2020-05-26 NOTE — Telephone Encounter (Signed)
Refill request Benicar Last refill 11/18/19 #45/1 Last office visit 02/26/20 Upcoming refill 09/13/20 See allergy/contraindication

## 2020-05-26 NOTE — Telephone Encounter (Signed)
Sent. Thanks.  Okay to continue. 

## 2020-06-30 ENCOUNTER — Ambulatory Visit: Payer: PPO | Admitting: Podiatry

## 2020-06-30 ENCOUNTER — Ambulatory Visit (INDEPENDENT_AMBULATORY_CARE_PROVIDER_SITE_OTHER): Payer: PPO

## 2020-06-30 ENCOUNTER — Other Ambulatory Visit: Payer: Self-pay

## 2020-06-30 DIAGNOSIS — M722 Plantar fascial fibromatosis: Secondary | ICD-10-CM | POA: Diagnosis not present

## 2020-06-30 MED ORDER — METHYLPREDNISOLONE 4 MG PO TBPK
ORAL_TABLET | ORAL | 0 refills | Status: DC
Start: 2020-06-30 — End: 2020-09-13

## 2020-06-30 MED ORDER — MELOXICAM 15 MG PO TABS
15.0000 mg | ORAL_TABLET | Freq: Every day | ORAL | 1 refills | Status: DC
Start: 2020-06-30 — End: 2020-09-13

## 2020-06-30 NOTE — Progress Notes (Signed)
   Subjective: 69 y.o. female presenting today as a new patient for evaluation of left heel pain has been going on for approximately 1 month now.  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  She has noticed heel pain for the last month.  She denies injury or trauma to the area.  Currently she has not done anything for treatment.  She presents for further treatment and evaluation   Past Medical History:  Diagnosis Date  . Arthritis   . Cancer [facility]) 2011 - Released by Dr. Jana Hakim 2012   stage 0 right breast S/P lumpectomy and sentinel node biopsy and radiation - per Dr. Donne Hazel.  . Carpal tunnel syndrome   . Diabetes mellitus without complication ([place])   . Elevated liver function tests    Mild increase due to fatty liver seen on [place], normal after weight loss  . GERD (gastroesophageal reflux disease)    Lack of relief with Prilosec and other OTC PPI's  . Hyperlipidemia   . Hypertension    Benign     Objective: Physical Exam General: The patient is alert and oriented x3 in no acute distress.  Dermatology: Skin is warm, dry and supple bilateral lower extremities. Negative for open lesions or macerations bilateral.   Vascular: Dorsalis Pedis and Posterior Tibial pulses palpable bilateral.  Capillary fill time is immediate to all digits.  Neurological: Epicritic and protective threshold intact bilateral.   Musculoskeletal: Tenderness to palpation to the plantar aspect of the left heel along the plantar fascia. All other joints range of motion within normal limits bilateral. Strength 5/5 in all groups bilateral.   Radiographic exam: Normal osseous mineralization. Joint spaces preserved. No fracture/dislocation/boney destruction. No other soft tissue abnormalities or radiopaque foreign bodies.   Assessment: 1. Plantar fasciitis left foot  Plan of Care:  1. Patient evaluated. Xrays reviewed.   2. Injection of 0.5cc Celestone soluspan  injected into the left plantar fascia.  3. Rx for Medrol Dose Pak placed 4. Rx for Meloxicam ordered for patient. 5. Plantar fascial band(s) dispensed  6. Instructed patient regarding therapies and modalities at home to alleviate symptoms.  7. Return to clinic in 4 weeks.    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  I have treated her husband   Edrick Kins, DPM [facility]  Dr. Edrick Kins, DPM    [address], [place] [address]                Office [phone]  Fax [phone]

## 2020-07-29 DIAGNOSIS — Z1231 Encounter for screening mammogram for malignant neoplasm of breast: Secondary | ICD-10-CM | POA: Diagnosis not present

## 2020-07-29 LAB — HM MAMMOGRAPHY

## 2020-08-02 ENCOUNTER — Other Ambulatory Visit: Payer: Self-pay

## 2020-08-02 ENCOUNTER — Ambulatory Visit: Payer: PPO | Admitting: Podiatry

## 2020-08-02 DIAGNOSIS — M722 Plantar fascial fibromatosis: Secondary | ICD-10-CM

## 2020-08-02 NOTE — Progress Notes (Signed)
   Subjective: 69 y.o. female presenting today for follow-up evaluation of left heel pain.  Patient states that she has gotten some slight improvement.  She says the injection helped slightly.  She was not able to tolerate the meloxicam.  She also got a new pair of sandals that she has been wearing daily.  She presents for further treatment and evaluation  Past Medical History:  Diagnosis Date  . Arthritis   . Cancer [facility]) 2011 - Released by Dr. Jana Hakim 2012   stage 0 right breast S/P lumpectomy and sentinel node biopsy and radiation - per Dr. Donne Hazel.  . Carpal tunnel syndrome   . Diabetes mellitus without complication ([place])   . Elevated liver function tests    Mild increase due to fatty liver seen on [place], normal after weight loss  . GERD (gastroesophageal reflux disease)    Lack of relief with Prilosec and other OTC PPI's  . Hyperlipidemia   . Hypertension    Benign     Objective: Physical Exam General: The patient is alert and oriented x3 in no acute distress.  Dermatology: Skin is warm, dry and supple bilateral lower extremities. Negative for open lesions or macerations bilateral.   Vascular: Dorsalis Pedis and Posterior Tibial pulses palpable bilateral.  Capillary fill time is immediate to all digits.  Neurological: Epicritic and protective threshold intact bilateral.   Musculoskeletal: Tenderness to palpation to the plantar aspect of the left heel along the plantar fascia. All other joints range of motion within normal limits bilateral. Strength 5/5 in all groups bilateral.   Radiographic exam: Normal osseous mineralization. Joint spaces preserved. No fracture/dislocation/boney destruction. No other soft tissue abnormalities or radiopaque foreign bodies.   Assessment: 1. Plantar fasciitis left foot  Plan of Care:  1. Patient evaluated. Xrays reviewed.   2. Injection of 0.5cc Celestone soluspan injected into the left plantar fascia.  3.  Discontinue all NSAIDs due to  GI sensitivity.  Recommend OTC Tylenol as needed 4.  Continue plantar fascial brace and Vionic sandals 5.  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  I have treated her husband   Edrick Kins, DPM [facility]  Dr. Edrick Kins, DPM    [address], [place] [address]                Office [phone]  Fax [phone]

## 2020-08-19 ENCOUNTER — Encounter: Payer: Self-pay | Admitting: Family Medicine

## 2020-08-21 ENCOUNTER — Other Ambulatory Visit: Payer: Self-pay | Admitting: Family Medicine

## 2020-08-23 ENCOUNTER — Ambulatory Visit: Payer: PPO | Admitting: Podiatry

## 2020-08-25 ENCOUNTER — Other Ambulatory Visit: Payer: Self-pay | Admitting: Family Medicine

## 2020-08-25 DIAGNOSIS — E119 Type 2 diabetes mellitus without complications: Secondary | ICD-10-CM

## 2020-09-09 ENCOUNTER — Other Ambulatory Visit: Payer: Self-pay

## 2020-09-09 ENCOUNTER — Other Ambulatory Visit (INDEPENDENT_AMBULATORY_CARE_PROVIDER_SITE_OTHER): Payer: PPO

## 2020-09-09 DIAGNOSIS — E119 Type 2 diabetes mellitus without complications: Secondary | ICD-10-CM | POA: Diagnosis not present

## 2020-09-09 LAB — HEMOGLOBIN A1C: Hgb A1c MFr Bld: 8.2 % — ABNORMAL HIGH (ref 4.6–6.5)

## 2020-09-09 LAB — COMPREHENSIVE METABOLIC PANEL
ALT: 70 U/L — ABNORMAL HIGH (ref 0–35)
AST: 58 U/L — ABNORMAL HIGH (ref 0–37)
Albumin: 4.2 g/dL (ref 3.5–5.2)
Alkaline Phosphatase: 46 U/L (ref 39–117)
BUN: 11 mg/dL (ref 6–23)
CO2: 31 mEq/L (ref 19–32)
Calcium: 9.8 mg/dL (ref 8.4–10.5)
Chloride: 97 mEq/L (ref 96–112)
Creatinine, Ser: 0.85 mg/dL (ref 0.40–1.20)
GFR: 70.44 mL/min (ref >60.00)
Glucose, Bld: 164 mg/dL — ABNORMAL HIGH (ref 70–99)
Potassium: 3.9 mEq/L (ref 3.5–5.1)
Sodium: 138 mEq/L (ref 135–145)
Total Bilirubin: 0.8 mg/dL (ref 0.2–1.2)
Total Protein: 6.9 g/dL (ref 6.0–8.3)

## 2020-09-09 LAB — LIPID PANEL
Cholesterol: 193 mg/dL (ref 0–200)
HDL: 48.6 mg/dL (ref >39.00)
LDL Cholesterol: 111 mg/dL — ABNORMAL HIGH (ref 0–99)
NonHDL: 144.57
Total CHOL/HDL Ratio: 4
Triglycerides: 167 mg/dL — ABNORMAL HIGH (ref 0.0–149.0)
VLDL: 33.4 mg/dL (ref 0.0–40.0)

## 2020-09-13 ENCOUNTER — Ambulatory Visit (INDEPENDENT_AMBULATORY_CARE_PROVIDER_SITE_OTHER): Payer: PPO | Admitting: Family Medicine

## 2020-09-13 ENCOUNTER — Encounter: Payer: Self-pay | Admitting: Family Medicine

## 2020-09-13 ENCOUNTER — Other Ambulatory Visit: Payer: Self-pay

## 2020-09-13 VITALS — BP 144/84 | HR 84 | Temp 98.3°F | Ht 60.5 in | Wt 203.0 lb

## 2020-09-13 DIAGNOSIS — E119 Type 2 diabetes mellitus without complications: Secondary | ICD-10-CM

## 2020-09-13 DIAGNOSIS — R7989 Other specified abnormal findings of blood chemistry: Secondary | ICD-10-CM

## 2020-09-13 DIAGNOSIS — I10 Essential (primary) hypertension: Secondary | ICD-10-CM | POA: Diagnosis not present

## 2020-09-13 MED ORDER — PRAVASTATIN SODIUM 10 MG PO TABS: 10.0000 mg | ORAL_TABLET | Freq: Every day | ORAL | 3 refills | Status: DC

## 2020-09-13 NOTE — Progress Notes (Signed)
This visit occurred during the SARS-CoV-2 public health emergency.  Safety protocols were in place, including screening questions prior to the visit, additional usage of staff PPE, and extensive cleaning of exam room while observing appropriate contact time as indicated for disinfecting solutions.  Tabled AMW visit due to other issues.  See below.    Discussed family situation re: her son in [place].  This is a strain for the patient.  Discussed her son dividing time between his family and his in laws.  She is trying to work though/adjust to the situation.    Diabetes: Using medications without difficulties: metformin 2 tabs BID at baseline. Hypoglycemic episodes: no sx Hyperglycemic episodes:no sx Feet problems: no tingling but h/o plantar fasciitis improved.   Blood Sugars averaging: not checked often eye exam within last year: yes  Hypertension:    Using medication without problems or lightheadedness: yes Chest pain with exertion:no Edema:no Short of breath:no  Lipids and statin start d/w pt.   LFT elevation.  H/o fatty liver.    She has L shoulder pain and L upper chest wall pain when laying on L side.   She does not have exertional symptoms.  PMH and SH reviewed  Meds, vitals, and allergies reviewed.   ROS: Per HPI.  Unless specifically indicated otherwise in HPI, the patient denies:  General: fever. Eyes: acute vision changes ENT: sore throat Cardiovascular: chest pain Respiratory: SOB GI: vomiting GU: dysuria Musculoskeletal: acute back pain Derm: acute rash Neuro: acute motor dysfunction Psych: worsening mood Endocrine: polydipsia Heme: bleeding Allergy: hayfever  GEN: nad, alert and oriented HEENT: ncat NECK: supple w/o LA CV: rrr. PULM: ctab, no inc wob ABD: soft, +bs EXT: no edema SKIN: no acute rash  Diabetic foot exam: Normal inspection No skin breakdown No calluses  Normal DP pulses Normal sensation to light touch and monofilament Nails  normal

## 2020-09-13 NOTE — Patient Instructions (Addendum)
Start pravastatin.  Recheck fasting labs in about 1 month at a lab visit.  Plan on recheck here in about 3 months.  We can do labs at the visit if needed.  Take care.  Glad to see you. Keep working on diet and exercise as best you can.

## 2020-09-15 NOTE — Assessment & Plan Note (Signed)
Continue olmesartan hydrochlorothiazide and continue work on diet and exercise.

## 2020-09-15 NOTE — Assessment & Plan Note (Addendum)
We can recheck A1c in about 3 months.  Continue work on diet and exercise in the meantime.  She agrees.  Continue Metformin.

## 2020-09-15 NOTE — Assessment & Plan Note (Addendum)
Lipids and statin start d/w pt.   LFT elevation.  H/o fatty liver.  Continue work on diet and exercise.  Would start pravastatin the meantime.  Routine cautions given to patient.  We can recheck labs in about 1 month.

## 2020-10-18 ENCOUNTER — Other Ambulatory Visit: Payer: PPO

## 2020-10-20 ENCOUNTER — Other Ambulatory Visit (INDEPENDENT_AMBULATORY_CARE_PROVIDER_SITE_OTHER): Payer: PPO

## 2020-10-20 ENCOUNTER — Other Ambulatory Visit: Payer: Self-pay

## 2020-10-20 DIAGNOSIS — E119 Type 2 diabetes mellitus without complications: Secondary | ICD-10-CM

## 2020-10-20 LAB — HEPATIC FUNCTION PANEL
ALT: 39 U/L — ABNORMAL HIGH (ref 0–35)
AST: 43 U/L — ABNORMAL HIGH (ref 0–37)
Albumin: 4 g/dL (ref 3.5–5.2)
Alkaline Phosphatase: 49 U/L (ref 39–117)
Bilirubin, Direct: 0.2 mg/dL (ref 0.0–0.3)
Total Bilirubin: 0.7 mg/dL (ref 0.2–1.2)
Total Protein: 7.3 g/dL (ref 6.0–8.3)

## 2020-10-20 LAB — LIPID PANEL
Cholesterol: 156 mg/dL (ref 0–200)
HDL: 47.8 mg/dL (ref >39.00)
LDL Cholesterol: 85 mg/dL (ref 0–99)
NonHDL: 107.86
Total CHOL/HDL Ratio: 3
Triglycerides: 112 mg/dL (ref 0.0–149.0)
VLDL: 22.4 mg/dL (ref 0.0–40.0)

## 2020-11-28 ENCOUNTER — Other Ambulatory Visit: Payer: Self-pay | Admitting: Family Medicine

## 2020-11-30 ENCOUNTER — Other Ambulatory Visit: Payer: Self-pay | Admitting: Family Medicine

## 2020-12-07 LAB — HM DIABETES EYE EXAM

## 2020-12-14 ENCOUNTER — Encounter: Payer: Self-pay | Admitting: Family Medicine

## 2020-12-14 ENCOUNTER — Ambulatory Visit (INDEPENDENT_AMBULATORY_CARE_PROVIDER_SITE_OTHER): Payer: PPO | Admitting: Family Medicine

## 2020-12-14 ENCOUNTER — Other Ambulatory Visit: Payer: Self-pay

## 2020-12-14 VITALS — BP 124/76 | HR 88 | Temp 97.5°F | Ht 61.0 in | Wt 200.0 lb

## 2020-12-14 DIAGNOSIS — E119 Type 2 diabetes mellitus without complications: Secondary | ICD-10-CM | POA: Diagnosis not present

## 2020-12-14 LAB — POCT GLYCOSYLATED HEMOGLOBIN (HGB A1C): Hemoglobin A1C: 8.9 % — AB (ref 4.0–5.6)

## 2020-12-14 MED ORDER — SITAGLIPTIN PHOSPHATE 100 MG PO TABS
50.0000 mg | ORAL_TABLET | Freq: Every day | ORAL | 3 refills | Status: DC
Start: 2020-12-14 — End: 2021-03-28

## 2020-12-14 NOTE — Progress Notes (Signed)
This visit occurred during the SARS-CoV-2 public health emergency.  Safety protocols were in place, including screening questions prior to the visit, additional usage of staff PPE, and extensive cleaning of exam room while observing appropriate contact time as indicated for disinfecting solutions.  Diabetes:  Using medications without difficulties: metformin 1000mg  BID.   Hypoglycemic episodes: only if prolonged fasting, rare events, cautions d/w pt.   Hyperglycemic episodes: no Feet problems: yes Blood Sugars averaging: not checked often eye exam within last year: yes, recently done A1c 8.9.  She wasn't surprised with the result.  D/w pt.    She is going back to the beach soon.  She is walking more when she is there.    Meds, vitals, and allergies reviewed.  ROS: Per HPI unless specifically indicated in ROS section   GEN: nad, alert and oriented HEENT: ncat NECK: supple w/o LA CV: rrr. PULM: ctab, no inc wob ABD: soft, +bs EXT: no edema SKIN: well perfused.

## 2020-12-14 NOTE — Patient Instructions (Addendum)
Plan on recheck in about 3-4 months, labs at the visit. You don't have to fast.   Try adding on januvia 50mg  and let me know if you don't tolerate that.    It would be reasonable to check your sugar some in the meantime.    Keep walking for exercise.    Take care.  Glad to see you.

## 2020-12-15 NOTE — Assessment & Plan Note (Signed)
A1c elevated, continue work on diet.  She is going to try to walk more.  Continue Metformin.  Add on Januvia 50 mg.  Would be okay to increase to 100 mg if needed.  Recheck in a few months.  She agrees.

## 2020-12-16 DIAGNOSIS — Z01419 Encounter for gynecological examination (general) (routine) without abnormal findings: Secondary | ICD-10-CM | POA: Diagnosis not present

## 2020-12-16 DIAGNOSIS — Z6828 Body mass index (BMI) 28.0-28.9, adult: Secondary | ICD-10-CM | POA: Diagnosis not present

## 2021-01-13 ENCOUNTER — Telehealth: Payer: Self-pay | Admitting: Family Medicine

## 2021-01-13 NOTE — Chronic Care Management (AMB) (Signed)
  Chronic Care Management   Note  01/13/2021 Name: Anita Cooper MRN: [id number] DOB: 10/13/1951  Anita Cooper is a 70 y.o. year old female who is a primary care patient of Tonia Ghent, MD. I reached out to [employer] by phone today in response to a referral sent by Ms. Sahiba P Ozimek's PCP, Tonia Ghent, MD.   Anita Cooper was given information about Chronic Care Management services today including:  1. CCM service includes personalized support from designated clinical staff supervised by her physician, including individualized plan of care and coordination with other care providers 2. 24/7 contact phone numbers for assistance for urgent and routine care needs. 3. Service will only be billed when office clinical staff spend 20 minutes or more in a month to coordinate care. 4. Only one practitioner may furnish and bill the service in a calendar month. 5. The patient may stop CCM services at any time (effective at the end of the month) by phone call to the office staff.   Patient agreed to services and verbal consent obtained.   Follow up plan:   [place]

## 2021-01-31 DIAGNOSIS — L814 Other melanin hyperpigmentation: Secondary | ICD-10-CM | POA: Diagnosis not present

## 2021-01-31 DIAGNOSIS — L2089 Other atopic dermatitis: Secondary | ICD-10-CM | POA: Diagnosis not present

## 2021-01-31 DIAGNOSIS — D1801 Hemangioma of skin and subcutaneous tissue: Secondary | ICD-10-CM | POA: Diagnosis not present

## 2021-01-31 DIAGNOSIS — L821 Other seborrheic keratosis: Secondary | ICD-10-CM | POA: Diagnosis not present

## 2021-01-31 DIAGNOSIS — D225 Melanocytic nevi of trunk: Secondary | ICD-10-CM | POA: Diagnosis not present

## 2021-02-18 ENCOUNTER — Telehealth: Payer: Self-pay

## 2021-02-18 NOTE — Chronic Care Management (AMB) (Addendum)
Chronic Care Management Pharmacy Assistant   Name: Anita Cooper  MRN: [id number] DOB: 16-Dec-1950   Reason for Encounter:  CCM/ Initial Questions    Conditions to be addressed/monitored: HTN and DMII   Recent office visits:  12/14/2020  Anita Stain, MD - Start Januvia 50-100 mg take 0.5 to 1 tablet daily 09/13/2020  Anita Stain, MD - Start Pravastatin 10 mg - 1 tablet daily  Recent consult visits:  12/16/2020  Anita Cooper, [place] [facility] visits:  None in previous 6 months  Medications: Outpatient Encounter Medications as of 02/18/2021  Medication Sig   aspirin 81 MG tablet Take 81 mg by mouth daily.   Blood Glucose Monitoring Suppl (ONE TOUCH ULTRA 2) w/Device KIT Use to check blood sugar once daily and as directed.  Diagnosis:  E11.9  Non insulin dependent.   diclofenac sodium (VOLTAREN) 1 % GEL Apply to right elbow TID prn   esomeprazole (NEXIUM) 40 MG capsule TAKE 1 CAPSULE BY MOUTH EVERY DAY BEFORE BREAKFAST   fexofenadine (ALLEGRA) 180 MG tablet Take 180 mg by mouth daily.   fluticasone (FLONASE) 50 MCG/ACT nasal spray USE 1-2 SPRAYS EACH NOSTRIL ONCE A DAY   ibuprofen (ADVIL) 200 MG tablet Take 400 mg by mouth every 6 (six) hours as needed.   metFORMIN (GLUCOPHAGE) 500 MG tablet TAKE UP TO 2 TABLETS TWICE A DAY.   Multiple Vitamin (MULTIVITAMIN) tablet Take 1 tablet by mouth daily.   olmesartan-hydrochlorothiazide (BENICAR HCT) 40-25 MG tablet TAKE 1/2 TABLETS BY MOUTH DAILY.   pravastatin (PRAVACHOL) 10 MG tablet Take 1 tablet (10 mg total) by mouth daily.   sitaGLIPtin (JANUVIA) 100 MG tablet Take 0.5-1 tablets (50-100 mg total) by mouth daily.   No facility-administered encounter medications on file as of 02/18/2021.     Lab Results  Component Value Date/Time   HGBA1C 8.9 (A) 12/14/2020 09:39 AM   HGBA1C 8.2 (H) 09/09/2020 08:18 AM   HGBA1C 7.2 (A) 02/26/2020 09:05 AM   HGBA1C 7.2 (H) 05/30/2019 08:06 AM     BP Readings from Last 3 Encounters:   12/14/20 124/76  09/13/20 (!) 144/84  02/26/20 140/82    Have you seen any other providers since your last visit with PCP? No  Any changes in your medications or health? No  Any side effects from any medications? Yes - The patient states she is having soreness in both of her legs, muscle aches, she believes it is related to pravastatin  Do you have an symptoms or problems not managed by your medications? No  Any concerns about your health right now? Yes - the patient has concerns about the pravastatin 3m.   Has your provider asked that you check blood pressure, blood sugar, or follow special diet at home? Yes , she checks BP and BG intermittently. She eats out at restaurants and cooks healthy meals at home. The patient reports she does not have a routine for taking  BP or BG, however she will document some readings for her upcoming appt, 02/28/2021 at 11:00am  Do you get any type of exercise on a regular basis? Yes - Daily walking for exercise.   Can you think of a goal you would like to reach for your health? No  Do you have any problems getting your medications? No  Is there anything that you would like to discuss during the appointment? Yes - Discuss leg aches, muscle aches, she feels this is different than aches after she walks for  exercise.  Anita Cooper was reminded to have all medications, supplements and any blood glucose and blood pressure readings available for review with Anita Cooper, Pharm. D, at her telephone visit on 02/28/2021 at 11:00 am  Star Rating Drugs:  Medication:  Last Fill: Day Supply Pravastatin 10 mg. 12/05/2020 90ds Olmesartan/HCTZ 11/30/2020 90ds Metformin 521m 12/03/2020 90ds Januvia 1073m 12/14/2020 90ds  Follow-Up:  Coordination of Enhanced Pharmacy Services and Pharmacist Review  Anita Cooper notified  Anita SensorCUniversity Of Maryland Shore Surgery Center At Queenstown LLClinical Pharmacy Assistant [phone] have reviewed the care management and care coordination activities  outlined in this encounter and I am certifying that I agree with the content of this note. No further action required.  Anita Cooper Clinical Pharmacist [phone]

## 2021-02-18 NOTE — Chronic Care Management (AMB) (Signed)
Entered in error.  02/18/2021  vh

## 2021-02-28 ENCOUNTER — Telehealth: Payer: Self-pay

## 2021-02-28 ENCOUNTER — Other Ambulatory Visit: Payer: Self-pay

## 2021-02-28 ENCOUNTER — Ambulatory Visit (INDEPENDENT_AMBULATORY_CARE_PROVIDER_SITE_OTHER): Payer: PPO

## 2021-02-28 DIAGNOSIS — E7800 Pure hypercholesterolemia, unspecified: Secondary | ICD-10-CM

## 2021-02-28 DIAGNOSIS — E119 Type 2 diabetes mellitus without complications: Secondary | ICD-10-CM | POA: Diagnosis not present

## 2021-02-28 DIAGNOSIS — I10 Essential (primary) hypertension: Secondary | ICD-10-CM

## 2021-02-28 NOTE — Telephone Encounter (Signed)
Patient reports muscle soreness in both legs the past 3 weeks. She thinks it may be related to her statin. We discussed trial off pravastatin until her visit with you in 4 weeks (Mar 27, 2021).   Debbora Dus, PharmD Clinical Pharmacist [facility] [phone]

## 2021-02-28 NOTE — Patient Instructions (Signed)
February 28, 2021  Dear Anita Cooper,  It was a pleasure meeting you during our initial appointment on February 28, 2021. Below is a summary of the goals we discussed and components of chronic care management. Please contact me anytime with questions or concerns.   Visit Information Patient Care Plan: CCM Pharmacy Care Plan    Problem Identified: [facility] AMB "PATIENT-SPECIFIC PROBLEM"     Long-Range Goal: Disease Management   Start Date: 02/28/2021  Priority: High  Note:    Current Barriers:  . None identified  Pharmacist Clinical Goal(s):  Marland Kitchen Patient will contact provider office for questions/concerns as evidenced notation of same in electronic health record through collaboration with PharmD and provider.   Interventions: . 1:1 collaboration with Tonia Ghent, MD regarding development and update of comprehensive plan of care as evidenced by provider attestation and co-signature . Inter-disciplinary care team collaboration (see longitudinal plan of care) . Comprehensive medication review performed; medication list updated in electronic medical record  Hypertension (BP goal <140/90) -Controlled - home and clinic readings within goal -Current treatment:  Olmesartan-hydrochlorothiazide (BENICAR HCT) 40-25 MG tablet - 1/2 tablet daily -Medications previously tried: none  -Current home readings: checks occasionally, arm cuff - usually runs 120-130s/70-72 -Current dietary habits: cooks healthy meals  -Current exercise habits: walks daily -Denies hypotensive/hypertensive symptoms -Educated on Symptoms of hypotension and importance of maintaining adequate hydration; -Counseled to monitor BP at home monthly, document, and provide log at future appointments -Recommended to continue current medication  Hyperlipidemia: (LDL goal < 100) -Controlled - LDL 85 -Current treatment:  Pravastatin (PRAVACHOL) 10 MG tablet - 1 tablet daily at bedtime (started 09/2020)  Aspirin 81 mg - 1 tablet  daily -Medications previously tried: pt reports prior taken off statin due to liver function    -The patient states she is having soreness in both of her legs starting 3 weeks ago. Mostly upper legs, thighs, and hips. She is having a hard time getting up and down. She believes it is related to pravastatin. She has not changed her exercise routine.  -Educated on Strategies to manage statin-induced myalgias;  -Recommended  holding statin until PCP visit next month, then consider retrial at lower dose.  Diabetes (A1c goal <7%) -Uncontrolled - A1c 8.9% 02/22 (Increased from 8.2% 10/22) She reports her A1c goal is < 7% -Current medications:  Metformin 500 mg - take 2 tablets twice daily with meals   Januvia 100 mg - 1/2 tablet daily (started 02/22) -Medications previously tried: none  -Current home glucose readings - checked the past 2 days, usually checks occasionally  . fasting glucose: 127, 121 (this morning) . post prandial glucose: none -Denies hypoglycemic/hyperglycemic symptoms -She reports a few spells of weakness, shakiness when she first started Januvia. She checked BG and it was 77 one time. She states this has gotten better the past few weeks. She believes she was walking and delayed a meal.  -Pt walking more and more aware of what she is eating.  -Pt tolerates Januvia well so far. -Educated on A1c and blood sugar goals; -Counseled to check feet daily and get yearly eye exams - up to date -Recommended to continue current medication; Follow up with PCP in 1 month. CCM in 6 months.  GERD (Goal: Control symptoms) -Controlled -Current treatment  . Esomeprazole 40 mg - 1 capsule daily before breakfast -Medications previously tried: none reported -Recommended to continue current medication  OTC Medications: Allegra daily Flonase daily Multivitamin Women 50+ daily Advil 200 mg -  takes rarely as needed  Voltaren gel 1% - not taking  Patient Goals/Self-Care Activities . Patient  will:  - take medications as prescribed check glucose daily, document, and provide at future appointments check blood pressure monthly, document, and provide at future appointments  Follow Up Plan: Telephone follow up appointment with care management team member scheduled for: 6 months      The patient verbalized understanding of instructions, educational materials, and care plan provided today and agreed to receive a mailed copy of patient instructions, educational materials, and care plan.   Debbora Dus, PharmD Clinical Pharmacist [facility] [phone]   Preventing Hypoglycemia Hypoglycemia occurs when the level of sugar (glucose) in the blood is too low. Hypoglycemia can happen in people who do or do not have diabetes (diabetes mellitus). It can develop quickly, and it can be a medical emergency. For most people with diabetes, a blood glucose level below 70 mg/dL (3.9 mmol/L) is considered hypoglycemia. Glucose is a type of sugar that provides the body's main source of energy. Certain hormones (insulin and glucagon) control the level of glucose in the blood. Insulin lowers blood glucose, and glucagon increases blood glucose. Hypoglycemia can result from having too much insulin in the bloodstream, or from not eating enough food that contains glucose. Your risk for hypoglycemia is higher:  If you take insulin or diabetes medicines to help lower your blood glucose or help your body make more insulin.  If you skip or delay a meal or snack.  If you are ill.  During and after exercise. You can prevent hypoglycemia by working with your health care provider to adjust your meal plan as needed and by taking other precautions. How can hypoglycemia affect me? Mild symptoms Mild hypoglycemia may not cause any symptoms. If you do have symptoms, they may include:  Hunger.  Anxiety.  Sweating and feeling clammy.  Dizziness or feeling  light-headed.  Sleepiness.  Nausea.  Increased heart rate.  Headache.  Blurry vision.  Irritability.  Tingling or numbness around the mouth, lips, or tongue.  A change in coordination.  Restless sleep. If mild hypoglycemia is not recognized and treated, it can quickly become moderate or severe hypoglycemia. Moderate symptoms Moderate hypoglycemia can cause:  Mental confusion and poor judgment.  Behavior changes.  Weakness.  Irregular heartbeat. Severe symptoms Severe hypoglycemia is a medical emergency. It can cause:  Fainting.  Seizures.  Loss of consciousness (coma).  Death. What nutrition changes can be made?  Work with your health care provider or diet and nutrition specialist (dietitian) to make a healthy meal plan that is right for you. Follow your meal plan carefully.  Eat meals at regular times.  If recommended by your health care provider, have snacks between meals.  Donot skip or delay meals or snacks. You can be at risk for hypoglycemia if you are not getting enough carbohydrates. What lifestyle changes can be made?  Work closely with your health care provider to manage your blood glucose. Make sure you know: ? Your goal blood glucose levels. ? How and when to check your blood glucose. ? The symptoms of hypoglycemia. It is important to treat it right away to keep it from becoming severe.  Do not drink alcohol on an empty stomach.  When you are ill, check your blood glucose more often than usual. Follow your sick day plan whenever you cannot eat or drink normally. Make this plan in advance with your health care provider.  Always  check your blood glucose before, during, and after exercise.   How is this treated? This condition can often be treated by immediately eating or drinking something that contains sugar, such as:  Fruit juice, 4-6 oz (120-150 mL).  Regular (not diet) soda, 4-6 oz (120-150 mL).  Low-fat milk, 4 oz (120 mL).  Several  pieces of hard candy.  Sugar or honey, 1 Tbsp (15 mL). Treating hypoglycemia if you have diabetes If you are alert and able to swallow safely, follow the 15:15 rule:  Take 15 grams of a rapid-acting carbohydrate. Talk with your health care provider about how much you should take.  Rapid-acting options include: ? Glucose pills (take 15 grams). ? 6-8 pieces of hard candy. ? 4-6 oz (120-150 mL) of fruit juice. ? 4-6 oz (120-150 mL) of regular (not diet) soda.  Check your blood glucose 15 minutes after you take the carbohydrate.  If the repeat blood glucose level is still at or below 70 mg/dL (3.9 mmol/L), take 15 grams of a carbohydrate again.  If your blood glucose level does not increase above 70 mg/dL (3.9 mmol/L) after 3 tries, seek emergency medical care.  After your blood glucose level returns to normal, eat a meal or a snack within 1 hour. Treating severe hypoglycemia Severe hypoglycemia is when your blood glucose level is at or below 54 mg/dL (3 mmol/L). Severe hypoglycemia is a medical emergency. Get medical help right away. If you have severe hypoglycemia and you cannot eat or drink, you may need an injection of glucagon. A family member or close friend should learn how to check your blood glucose and how to give you a glucagon injection. Ask your health care provider if you need to have an emergency glucagon injection kit available. Severe hypoglycemia may need to be treated in a hospital. The treatment may include getting glucose through an IV. You may also need treatment for the cause of your hypoglycemia. Where to find more information  American Diabetes Association: www.diabetes.[employer] of Diabetes and Digestive and Kidney Diseases: [employer] Contact a health care provider if:  You have problems keeping your blood glucose in your target range.  You have frequent episodes of hypoglycemia. Get help right away if:  You continue to have  hypoglycemia symptoms after eating or drinking something containing glucose.  Your blood glucose level is at or below 54 mg/dL (3 mmol/L).  You faint.  You have a seizure. These symptoms may represent a serious problem that is an emergency. Do not wait to see if the symptoms will go away. Get medical help right away. Call your local emergency services (911 in the [place] Summary  Know the symptoms of hypoglycemia, and when you are at risk for it (such as during exercise or when you are sick). Check your blood glucose often when you are at risk for hypoglycemia.  Hypoglycemia can develop quickly, and it can be dangerous if it is not treated right away. If you have a history of severe hypoglycemia, make sure you know how to use your glucagon injection kit.  Make sure you know how to treat hypoglycemia. Keep a carbohydrate snack available when you may be at risk for hypoglycemia. This information is not intended to replace advice given to you by your health care provider. Make sure you discuss any questions you have with your health care provider. Document Revised: 02/21/2019 Document Reviewed: 06/27/2017 Elsevier Patient Education  2021 [employer].

## 2021-02-28 NOTE — Progress Notes (Signed)
Chronic Care Management Pharmacy Note  02/28/2021 Name:  Anita Cooper MRN:  [id number] DOB:  11-28-1950  Subjective: Anita Cooper is an 70 y.o. year old female who is a primary patient of Damita Dunnings, Elveria Rising, MD.  The CCM team was consulted for assistance with disease management and care coordination needs.    Engaged with patient by telephone for initial visit in response to provider referral for pharmacy case management and/or care coordination services.   Consent to Services:  The patient was given the following information about Chronic Care Management services today, agreed to services, and gave verbal consent: 1. CCM service includes personalized support from designated clinical staff supervised by the primary care provider, including individualized plan of care and coordination with other care providers 2. 24/7 contact phone numbers for assistance for urgent and routine care needs. 3. Service will only be billed when office clinical staff spend 20 minutes or more in a month to coordinate care. 4. Only one practitioner may furnish and bill the service in a calendar month. 5.The patient may stop CCM services at any time (effective at the end of the month) by phone call to the office staff. 6. The patient will be responsible for cost sharing (co-pay) of up to 20% of the service fee (after annual deductible is met). Patient agreed to services and consent obtained.  Patient Care Team: Tonia Ghent, MD as PCP - Claris Gower, [facility] as Pharmacist (Pharmacist)  Recent office visits: 12/14/2020  [name]  MD - Start Januvia 50-100 mg take 0.5 to 1 tab daily 09/13/2020  [name] MD - Start Pravastatin10 mg - 1 tablet daily  Recent consult visits:  12/16/2020  [place] [facility] visits: None in previous 6 months  Objective:  Lab Results  Component Value Date   CREATININE 0.85 09/09/2020   BUN 11 09/09/2020   GFR 70.44 09/09/2020   GFRNONAA 72.94  08/19/2010   GFRAA  12/14/2009    >60        The eGFR has been calculated using the MDRD equation. This calculation has not been validated in all clinical situations. eGFR's persistently <60 mL/min signify possible Chronic Kidney Disease.   NA 138 09/09/2020   K 3.9 09/09/2020   CALCIUM 9.8 09/09/2020   CO2 31 09/09/2020   GLUCOSE 164 (H) 09/09/2020    Lab Results  Component Value Date/Time   HGBA1C 8.9 (A) 12/14/2020 09:39 AM   HGBA1C 8.2 (H) 09/09/2020 08:18 AM   HGBA1C 7.2 (A) 02/26/2020 09:05 AM   HGBA1C 7.2 (H) 05/30/2019 08:06 AM   GFR 70.44 09/09/2020 08:18 AM   GFR 65.81 05/30/2019 08:06 AM    Last diabetic Eye exam:  Lab Results  Component Value Date/Time   HMDIABEYEEXA No Retinopathy 12/07/2020 12:00 AM    Last diabetic Foot exam: 09/13/21 normal per PCP   Lab Results  Component Value Date   CHOL 156 10/20/2020   HDL 47.80 10/20/2020   LDLCALC 85 10/20/2020   LDLDIRECT 141.2 12/03/2012   TRIG 112.0 10/20/2020   CHOLHDL 3 10/20/2020    Hepatic Function Latest Ref Rng & Units 10/20/2020 09/09/2020 09/05/2019  Total Protein 6.0 - 8.3 g/dL 7.3 6.9 7.3  Albumin 3.5 - 5.2 g/dL 4.0 4.2 -  AST 0 - 37 U/L 43(H) 58(H) 45(H)  ALT 0 - 35 U/L 39(H) 70(H) 49(H)  Alk Phosphatase 39 - 117 U/L 49 46 -  Total Bilirubin 0.2 - 1.2 mg/dL  0.7 0.8 0.5  Bilirubin, Direct 0.0 - 0.3 mg/dL 0.2 - 0.1    No results found for: TSH, FREET4  CBC Latest Ref Rng & Units 01/05/2010 12/14/2009  WBC 3.9 - 10.3 10e3/uL 8.3 9.6  Hemoglobin 11.6 - 15.9 g/dL 12.6 13.5  Hematocrit 34.8 - 46.6 % 37.3 40.4  Platelets 145 - 400 10e3/uL 356 337    No results found for: VD25OH  Clinical ASCVD: No  The 10-year ASCVD risk score Mikey Bussing DC Jr., et al., 2013) is: 16.9%   Values used to calculate the score:     Age: 34 years     Sex: Female     Is Non-Hispanic African American: No     Diabetic: Yes     Tobacco smoker: No     Systolic Blood Pressure: 025 mmHg     Is BP treated: Yes     HDL  Cholesterol: 47.8 mg/dL     Total Cholesterol: 156 mg/dL    Depression screen [facility] 2/9 09/13/2020 06/03/2019 05/23/2018  Decreased Interest 0 0 0  Down, Depressed, Hopeless 0 0 0  PHQ - 2 Score 0 0 0    Social History   Tobacco Use  Smoking Status Never Smoker  Smokeless Tobacco Never Used   BP Readings from Last 3 Encounters:  12/14/20 124/76  09/13/20 (!) 144/84  02/26/20 140/82   Pulse Readings from Last 3 Encounters:  12/14/20 88  09/13/20 84  02/26/20 99   Wt Readings from Last 3 Encounters:  12/14/20 200 lb (90.7 kg)  09/13/20 203 lb (92.1 kg)  02/26/20 206 lb 1 oz (93.5 kg)   BMI Readings from Last 3 Encounters:  12/14/20 37.79 kg/m  09/13/20 38.99 kg/m  02/26/20 39.26 kg/m    Assessment/Interventions: Review of patient past medical history, allergies, medications, health status, including review of consultants reports, laboratory and other test data, was performed as part of comprehensive evaluation and provision of chronic care management services.   SDOH:  (Social Determinants of Health) assessments and interventions performed: Yes SDOH Interventions   Flowsheet Row Most Recent Value  SDOH Interventions   Financial Strain Interventions Intervention Not Indicated  [Medications affordable]     SDOH Screenings   Alcohol Screen: Not on file  Depression (PHQ2-9): Low Risk   . PHQ-2 Score: 0  Financial Resource Strain: Low Risk   . Difficulty of Paying Living Expenses: Not very hard  Food Insecurity: Not on file  Housing: Not on file  Physical Activity: Not on file  Social Connections: Not on file  Stress: Not on file  Tobacco Use: Low Risk   . Smoking Tobacco Use: Never Smoker  . Smokeless Tobacco Use: Never Used  Transportation Needs: Not on file    CCM Care Plan  Allergies  Allergen Reactions  . Oseltamivir Nausea Only    Other reaction(s): Insomnia  . Benadryl [Diphenhydramine Hcl]     Insomnia and headache  . Losartan     Presumed cause  of itching, tolerated benicar    Medications Reviewed Today    Reviewed by Debbora Dus, [facility] (Pharmacist) on 02/28/21 at 1202  Med List Status: <None>  Medication Order Taking? Sig Documenting Provider Last Dose Status Informant  aspirin 81 MG tablet [id number] Yes Take 81 mg by mouth daily. [provider] Taking Active   Blood Glucose Monitoring Suppl (ONE TOUCH ULTRA 2) w/Device KIT [id number] Yes Use to check blood sugar once daily and as directed.  Diagnosis:  E11.9  Non insulin  dependent. Tonia Ghent, MD Taking Active   esomeprazole (NEXIUM) 40 MG capsule [id number] Yes TAKE 1 CAPSULE BY MOUTH EVERY DAY BEFORE BREAKFAST Tonia Ghent, MD Taking Active   fexofenadine [facility]) 180 MG tablet [id number] Yes Take 180 mg by mouth daily. [provider] Taking Active   fluticasone (FLONASE) 50 MCG/ACT nasal spray [id number] Yes USE 1-2 SPRAYS EACH NOSTRIL ONCE A DAY Tonia Ghent, MD Taking Active   ibuprofen (ADVIL) 200 MG tablet [id number]  Take 400 mg by mouth every 6 (six) hours as needed. [provider]  Active   metFORMIN (GLUCOPHAGE) 500 MG tablet [id number] Yes TAKE UP TO 2 TABLETS TWICE A DAY. Tonia Ghent, MD Taking Active   Multiple Vitamin (MULTIVITAMIN) tablet [id number] Yes Take 1 tablet by mouth daily. [provider] Taking Active   olmesartan-hydrochlorothiazide (BENICAR HCT) 40-25 MG tablet [id number] Yes TAKE 1/2 TABLETS BY MOUTH DAILY. Tonia Ghent, MD Taking Active   pravastatin (PRAVACHOL) 10 MG tablet [id number] Yes Take 1 tablet (10 mg total) by mouth daily. Tonia Ghent, MD Taking Active   sitaGLIPtin [facility]) 100 MG tablet [id number] Yes Take 0.5-1 tablets (50-100 mg total) by mouth daily.  Patient taking differently: Take 50-100 mg by mouth daily. 1/2 tablet daily   Tonia Ghent, MD Taking Active Self          Patient Active Problem List   Diagnosis Date Noted  . LFT elevation 06/04/2019  . Advance care  planning 05/26/2018  . Left leg swelling 03/12/2018  . Allergic rhinitis 01/29/2014  . Diabetes mellitus without complication ([place]) 26/94/8546  . Medicare annual wellness visit, subsequent 12/03/2012  . History of breast cancer in female 08/07/2011  . GERD (gastroesophageal reflux disease) 05/18/2011  . HYPERCHOLESTEROLEMIA 08/31/2010  . CARPAL TUNNEL SYNDROME 08/31/2010  . HYPERTENSION, BENIGN 08/19/2010    Immunization History  Administered Date(s) Administered  . Influenza Split 08/07/2013  . Influenza Whole 08/13/2009, 08/05/2010  . Influenza, High Dose Seasonal PF 09/06/2018, 08/21/2019, 08/21/2019  . Influenza, Seasonal, Injecte, Preservative Fre 08/17/2015  . Influenza-Unspecified 08/14/2017, 09/06/2018, 08/23/2020  . PFIZER(Purple Top)SARS-COV-2 Vaccination 12/19/2019, 01/13/2020, 08/26/2020  . Pneumococcal Conjugate-13 05/23/2018  . Pneumococcal Polysaccharide-23 08/02/2015  . Td 11/14/2003  . Tdap 09/29/2013  . Zoster 09/29/2013  . Zoster Recombinat (Shingrix) 01/09/2019, 07/23/2019    Conditions to be addressed/monitored:  Hypertension, Hyperlipidemia and Diabetes  Care Plan : [place]  Updates made by Debbora Dus, [place] since 02/28/2021 12:00 AM    Problem: CHL AMB "PATIENT-SPECIFIC PROBLEM"     Long-Range Goal: Disease Management   Start Date: 02/28/2021  Priority: High  Note:    Current Barriers:  . None identified  Pharmacist Clinical Goal(s):  Marland Kitchen Patient will contact provider office for questions/concerns as evidenced notation of same in electronic health record through collaboration with PharmD and provider.   Interventions: . 1:1 collaboration with Tonia Ghent, MD regarding development and update of comprehensive plan of care as evidenced by provider attestation and co-signature . Inter-disciplinary care team collaboration (see longitudinal plan of care) . Comprehensive medication review performed; medication list updated in  electronic medical record  Hypertension (BP goal <140/90) -Controlled - home and clinic readings within goal -Current treatment:  Olmesartan-hydrochlorothiazide (BENICAR HCT) 40-25 MG tablet - 1/2 tablet daily -Medications previously tried: none  -Current home readings: checks occasionally, arm cuff - usually runs 120-130s/70-72 -Current dietary habits: cooks healthy meals  -Current exercise habits: walks daily -Denies hypotensive/hypertensive symptoms -Educated on  Symptoms of hypotension and importance of maintaining adequate hydration; -Counseled to monitor BP at home monthly, document, and provide log at future appointments -Recommended to continue current medication  Hyperlipidemia: (LDL goal < 100) -Controlled - LDL 85 -Current treatment:  Pravastatin (PRAVACHOL) 10 MG tablet - 1 tablet daily at bedtime (started 09/2020)  Aspirin 81 mg - 1 tablet daily -Medications previously tried: pt reports prior taken off statin due to liver function    -The patient states she is having soreness in both of her legs starting 3 weeks ago. Mostly upper legs, thighs, and hips. She is having a hard time getting up and down. She believes it is related to pravastatin. She has not changed her exercise routine.  -Educated on Strategies to manage statin-induced myalgias;  -Recommended  holding statin until PCP visit next month, then consider retrial at lower dose.  Diabetes (A1c goal <7%) -Uncontrolled - A1c 8.9% 02/22 (Increased from 8.2% 10/22) She reports her A1c goal is < 7% -Current medications:  Metformin 500 mg - take 2 tablets twice daily with meals   Januvia 100 mg - 1/2 tablet daily (started 02/22) -Medications previously tried: none  -Current home glucose readings - checked the past 2 days, usually checks occasionally  . fasting glucose: 127, 121 (this morning) . post prandial glucose: none -Denies hypoglycemic/hyperglycemic symptoms -She reports a few spells of weakness, shakiness  when she first started Januvia. She checked BG and it was 77 one time. She states this has gotten better the past few weeks. She believes she was walking and delayed a meal.  -Pt walking more and more aware of what she is eating.  -Pt tolerates Januvia well so far. -Educated on A1c and blood sugar goals; -Counseled to check feet daily and get yearly eye exams - up to date -Recommended to continue current medication; Follow up with PCP in 1 month. CCM in 6 months.  GERD (Goal: Control symptoms) -Controlled -Current treatment  . Esomeprazole 40 mg - 1 capsule daily before breakfast -Medications previously tried: none reported -Recommended to continue current medication  OTC Medications: Allegra daily Flonase daily Multivitamin Women 50+ daily Advil 200 mg - takes rarely as needed  Voltaren gel 1% - not taking  Patient Goals/Self-Care Activities . Patient will:  - take medications as prescribed check glucose daily, document, and provide at future appointments check blood pressure monthly, document, and provide at future appointments  Follow Up Plan: Telephone follow up appointment with care management team member scheduled for: 6 months    Medication Assistance: None required.  Patient affirms current coverage meets needs.  Star Rating Drugs:  No gaps in adherence  Medication:                Last Fill:         Day Supply Pravastatin 10 mg.       12/05/2020        90ds Olmesartan/HCTZ       11/30/2020        90ds Metformin 57m        12/03/2020        90ds Januvia 1053m           12/14/2020          90ds  Patient's preferred pharmacy is: [id number] [place] [address] [place]: [phone]: [phone] is her main pharmacy. Denies any issues with  medication refills or coordination.  Uses pill box? Yes Pt endorses 100% compliance  We discussed:  Benefits of medication synchronization, packaging and delivery as well as enhanced pharmacist oversight with [employer]. Patient decided to: Continue current medication management strategy  Care Plan and Follow Up Patient Decision:  Patient agrees to Care Plan and Follow-up.  Debbora Dus, PharmD Clinical Pharmacist [facility] [phone]

## 2021-03-01 NOTE — Telephone Encounter (Signed)
Agreed.  Thanks.  

## 2021-03-03 ENCOUNTER — Other Ambulatory Visit: Payer: Self-pay | Admitting: Family Medicine

## 2021-03-28 ENCOUNTER — Encounter: Payer: Self-pay | Admitting: Family Medicine

## 2021-03-28 ENCOUNTER — Other Ambulatory Visit: Payer: Self-pay

## 2021-03-28 ENCOUNTER — Ambulatory Visit (INDEPENDENT_AMBULATORY_CARE_PROVIDER_SITE_OTHER): Payer: PPO | Admitting: Family Medicine

## 2021-03-28 VITALS — BP 124/80 | HR 101 | Temp 97.3°F | Ht 61.0 in | Wt 192.0 lb

## 2021-03-28 DIAGNOSIS — Z23 Encounter for immunization: Secondary | ICD-10-CM

## 2021-03-28 DIAGNOSIS — E119 Type 2 diabetes mellitus without complications: Secondary | ICD-10-CM

## 2021-03-28 DIAGNOSIS — G720 Drug-induced myopathy: Secondary | ICD-10-CM

## 2021-03-28 LAB — POCT GLYCOSYLATED HEMOGLOBIN (HGB A1C): Hemoglobin A1C: 6.5 % — AB (ref 4.0–5.6)

## 2021-03-28 MED ORDER — SITAGLIPTIN PHOSPHATE 100 MG PO TABS: ORAL_TABLET | ORAL | Status: DC

## 2021-03-28 NOTE — Patient Instructions (Addendum)
Stop [place].  Plan on recheck in about 6 months at yearly visit, update me sooner if needed.  Thanks for your effort.  Take care.  Glad to see you. PNA 23 today.

## 2021-03-28 NOTE — Progress Notes (Signed)
This visit occurred during the SARS-CoV-2 public health emergency.  Safety protocols were in place, including screening questions prior to the visit, additional usage of staff PPE, and extensive cleaning of exam room while observing appropriate contact time as indicated for disinfecting solutions.  Diabetes:  Using medications without difficulties: yes Hypoglycemic episodes: down to 75, at lowest, once with prolonged fasting Hyperglycemic episodes: no Feet problems:no Blood Sugars averaging: 90-130, usually on the lower end of range eye exam within last year: yes She is walking more.  Weight is down intentionally.   A1c down to 6.5 from 8.9.    She had aches attributed to pravastatin.  She stopped med and improved.  Statin intolerant.    Pneumonia vaccine done at office visit.  Meds, vitals, and allergies reviewed.  ROS: Per HPI unless specifically indicated in ROS section   GEN: nad, alert and oriented HEENT: ncat NECK: supple w/o LA CV: rrr. PULM: ctab, no inc wob ABD: soft, +bs EXT: no edema SKIN: no acute rash  Diabetic foot exam: Normal inspection No skin breakdown No calluses  Normal DP pulses Normal sensation to light touch and monofilament Nails normal

## 2021-03-30 DIAGNOSIS — G720 Drug-induced myopathy: Secondary | ICD-10-CM | POA: Insufficient documentation

## 2021-03-30 NOTE — Assessment & Plan Note (Signed)
Statin intolerant 

## 2021-03-30 NOTE — Assessment & Plan Note (Signed)
A1c greatly improved.  Continue work on diet and exercise.  Recheck periodically.  Pneumonia vaccine done at office visit.  Statin intolerant.  Would stop Januvia in the meantime.  She can update me if she has significant sugar elevation in the meantime.  Continue metformin.

## 2021-04-14 ENCOUNTER — Telehealth: Payer: Self-pay

## 2021-04-14 NOTE — Chronic Care Management (AMB) (Addendum)
  Chronic Care Management Pharmacy Assistant   Name: Anita Cooper  MRN: [id number] DOB: 11/03/1951  Reason for Encounter: Disease State - Diabetes, HLD  Recent office visits:  03/28/21 - PCP - Stopped Januvia once daily due to improvement in A1c. Continue metformin only. Patient muscle pain resolved off statin. Discontinue Pravastatin. Statin intolerant. Follow up 6 months.  Recent consult visits:  None since last CCM contact  Hospital visits:  None in previous 6 months  Medications: Outpatient Encounter Medications as of 04/14/2021  Medication Sig   aspirin 81 MG tablet Take 81 mg by mouth daily.   Blood Glucose Monitoring Suppl (ONE TOUCH ULTRA 2) w/Device KIT Use to check blood sugar once daily and as directed.  Diagnosis:  E11.9  Non insulin dependent.   esomeprazole (NEXIUM) 40 MG capsule TAKE 1 CAPSULE BY MOUTH EVERY DAY BEFORE BREAKFAST   fexofenadine (ALLEGRA) 180 MG tablet Take 180 mg by mouth daily.   fluticasone (FLONASE) 50 MCG/ACT nasal spray USE 1-2 SPRAYS EACH NOSTRIL ONCE A DAY   ibuprofen (ADVIL) 200 MG tablet Take 400 mg by mouth every 6 (six) hours as needed.   metFORMIN (GLUCOPHAGE) 500 MG tablet TAKE UP TO 2 TABLETS TWICE A DAY.   Multiple Vitamin (MULTIVITAMIN) tablet Take 1 tablet by mouth daily.   olmesartan-hydrochlorothiazide (BENICAR HCT) 40-25 MG tablet TAKE 1/2 TABLETS BY MOUTH DAILY.   No facility-administered encounter medications on file as of 04/14/2021.   Recent Relevant Labs: Lab Results  Component Value Date/Time   HGBA1C 6.5 (A) 03/28/2021 10:14 AM   HGBA1C 8.9 (A) 12/14/2020 09:39 AM   HGBA1C 8.2 (H) 09/09/2020 08:18 AM   HGBA1C 7.2 (H) 05/30/2019 08:06 AM    Kidney Function Lab Results  Component Value Date/Time   CREATININE 0.85 09/09/2020 08:18 AM   CREATININE 0.86 05/30/2019 08:06 AM   GFR 70.44 09/09/2020 08:18 AM   GFRNONAA 72.94 08/19/2010 11:06 AM   GFRAA  12/14/2009 03:45 PM    >60        The eGFR has been  calculated using the MDRD equation. This calculation has not been validated in all clinical situations. eGFR's persistently <60 mL/min signify possible Chronic Kidney Disease.    Current antihyperglycemic regimen:   Metformin 500mg - 2 tablets twice daily    Patient verbally confirms she is taking the above medications as directed: Yes  What recent interventions/DTPs have been made to improve glycemic control:   PCP stopped Januvia due to A1c greatly improved on 03/28/21  Have there been any recent hospitalizations or ED visits since last visit with CPP? No  Patient denies hypoglycemic symptoms, including Pale, Sweaty, Shaky, Hungry, Nervous/irritable and Vision changes  Patient denies hyperglycemic symptoms, including blurry vision, excessive thirst, fatigue, polyuria and weakness  How often are you checking your blood sugar? Few times a month  What are your blood sugars ranging?  Fasting:  04/13/21-121 Before meals: n/a After meals: n/a Bedtime: n/a  During the week, how often does your blood glucose drop below 70? Never  Are you checking your feet daily/regularly? Yes  Adherence Review: Is the patient currently on a STATIN medication? No - intolerant to pravastatin 10 mg daily  Is the patient currently on ACE/ARB medication? Yes Does the patient have >5 day gap between last estimated fill dates? No  Star Rating Drugs:  Medication:  Last Fill: Day Supply Olmesartan/HCTZ       03/03/2021        90ds Metformin 500mg          03/04/2021        90ds   04/14/2021 Name: Anita Cooper MRN: [id number] DOB: 04/05/1951 Anita Cooper is a 69 y.o. year old female who is a primary care patient of Anita Cooper.  Comprehensive medication review performed; Spoke to patient regarding cholesterol.  Lipid Panel    Component Value Date/Time   CHOL 156 10/20/2020 0901   TRIG 112.0 10/20/2020 0901   HDL 47.80 10/20/2020 0901   LDLCALC 85 10/20/2020 0901   LDLDIRECT 141.2  12/03/2012 1055    10-year ASCVD risk score: The 10-year ASCVD risk score (Goff DC Jr., et al., 2013) is: 16.9%   Values used to calculate the score:     Age: 69 years     Sex: Female     Is Non-Hispanic African American: No     Diabetic: Yes     Tobacco smoker: No     Systolic Blood Pressure: 124 mmHg     Is BP treated: Yes     HDL Cholesterol: 47.8 mg/dL     Total Cholesterol: 156 mg/dL  Current antihyperlipidemic regimen: None   Previous antihyperlipidemic medications tried:  Pravastatin 10mg unable to tolerate  ASCVD risk enhancing conditions: age >65, DM and HTN   What recent interventions/DTPs have been made by any provider to improve Cholesterol control since last CPP Visit:  She had aches attributed to pravastatin 02/2021.  She stopped med per CCM pharmacist recommendation and pain improved. Saw PCP 5/22 and statin formally d/c.  Any recent hospitalizations or ED visits since last visit with CPP? No  What diet changes have been made to improve Cholesterol? None identified  What exercise is being done to improve Cholesterol?  She does a walking program for exercise  No appointments scheduled within the next 30 days.   Anita Cooper, CPP notified   , CCMA Clincal Pharmacy Assistant [phone]  I have reviewed the care management and care coordination activities outlined in this encounter and I am certifying that I agree with the content of this note. Will revisit options for cholesterol (I.e Zetia) at CCM follow up.   Anita Cooper Clinical Pharmacist Anita Cooper [phone]  

## 2021-04-27 ENCOUNTER — Encounter: Payer: Self-pay | Admitting: Family Medicine

## 2021-04-27 ENCOUNTER — Ambulatory Visit (INDEPENDENT_AMBULATORY_CARE_PROVIDER_SITE_OTHER): Payer: PPO | Admitting: Family Medicine

## 2021-04-27 ENCOUNTER — Other Ambulatory Visit: Payer: Self-pay

## 2021-04-27 VITALS — BP 138/68 | HR 87 | Temp 98.0°F | Ht 61.0 in | Wt 195.5 lb

## 2021-04-27 DIAGNOSIS — B372 Candidiasis of skin and nail: Secondary | ICD-10-CM | POA: Diagnosis not present

## 2021-04-27 MED ORDER — FLUCONAZOLE 150 MG PO TABS: ORAL_TABLET | ORAL | 1 refills | Status: DC

## 2021-04-27 MED ORDER — NYSTATIN 100000 UNIT/GM EX POWD: 1.0000 "application " | Freq: Three times a day (TID) | CUTANEOUS | 1 refills | Status: DC

## 2021-04-27 NOTE — Progress Notes (Signed)
T. , MD, [place] at [facility] [place], [address]  Phone: [phone]  FAX: [phone]  Anita Cooper - 70 y.o. female  MRN [id number]  Date of Birth: 08-13-1951  Date: 04/27/2021  PCP: Tonia Ghent, MD  Referral: Tonia Ghent, MD  Chief Complaint  Patient presents with   Rash    Abdomen/Pubic Area/Around Breast    This visit occurred during the SARS-CoV-2 public health emergency.  Safety protocols were in place, including screening questions prior to the visit, additional usage of staff PPE, and extensive cleaning of exam room while observing appropriate contact time as indicated for disinfecting solutions.   Subjective:   Anita Cooper is a 70 y.o. very pleasant female patient with Body mass index is 36.94 kg/m. who presents with the following:  Yeast:     Lab Results  Component Value Date   HGBA1C 6.5 (A) 03/28/2021    This all started in her inguinal folds after wearing her swimsuit for an extended amount of time.  Now it is spread and is under her breasts as well as in the armpit region and to a lesser extent around the neck and other areas.  It is uncomfortable and itchy in some places.  Review of Systems is noted in the HPI, as appropriate  Objective:   BP 138/68   Pulse 87   Temp 98 F (36.7 C) (Temporal)   Ht 5' 1"  (1.549 m)   Wt 195 lb 8 oz (88.7 kg)   SpO2 96%   BMI 36.94 kg/m   GEN: No acute distress; alert,appropriate. PULM: Breathing comfortably in no respiratory distress PSYCH: Normally interactive.   This portion of the physical examination was chaperoned by Hedy Camara, CMA.  She does have significant inguinal fold pinkish/reddish flat rash with borders as well as some less severe changes underneath the breast, armpits, and some modest changes around other portions of her body.  Laboratory and Imaging Data:  Assessment and Plan:     ICD-10-CM    1. Skin yeast infection  B37.2      Cutaneous candidiasis, I expect that this will do fine.  Meds ordered this encounter  Medications   fluconazole (DIFLUCAN) 150 MG tablet    Sig: One tablet every 3 days    Dispense:  3 tablet    Refill:  1   nystatin (MYCOSTATIN/NYSTOP) powder    Sig: Apply 1 application topically 3 (three) times daily.    Dispense:  60 g    Refill:  1   There are no discontinued medications. No orders of the defined types were placed in this encounter.   Follow-up: No follow-ups on file.  Signed,  Maud Deed. , MD   Outpatient Encounter Medications as of 04/27/2021  Medication Sig   aspirin 81 MG tablet Take 81 mg by mouth daily.   Blood Glucose Monitoring Suppl (ONE TOUCH ULTRA 2) w/Device KIT Use to check blood sugar once daily and as directed.  Diagnosis:  E11.9  Non insulin dependent.   esomeprazole (NEXIUM) 40 MG capsule TAKE 1 CAPSULE BY MOUTH EVERY DAY BEFORE BREAKFAST   fexofenadine (ALLEGRA) 180 MG tablet Take 180 mg by mouth daily.   fluconazole (DIFLUCAN) 150 MG tablet One tablet every 3 days   fluticasone (FLONASE) 50 MCG/ACT nasal spray USE 1-2 SPRAYS EACH NOSTRIL ONCE A DAY   ibuprofen (ADVIL) 200 MG tablet Take 400 mg by mouth every  6 (six) hours as needed.   metFORMIN (GLUCOPHAGE) 500 MG tablet TAKE UP TO 2 TABLETS TWICE A DAY.   Multiple Vitamin (MULTIVITAMIN) tablet Take 1 tablet by mouth daily.   nystatin (MYCOSTATIN/NYSTOP) powder Apply 1 application topically 3 (three) times daily.   olmesartan-hydrochlorothiazide (BENICAR HCT) 40-25 MG tablet TAKE 1/2 TABLETS BY MOUTH DAILY.   triamcinolone cream (KENALOG) 0.1 % Apply 1 application topically 2 (two) times daily as needed.   No facility-administered encounter medications on file as of 04/27/2021.

## 2021-05-02 ENCOUNTER — Telehealth: Payer: Self-pay

## 2021-05-02 MED ORDER — TRIAMCINOLONE ACETONIDE 0.1 % EX CREA: 1.0000 "application " | TOPICAL_CREAM | Freq: Two times a day (BID) | CUTANEOUS | 0 refills | Status: AC

## 2021-05-02 MED ORDER — PREDNISONE 20 MG PO TABS: ORAL_TABLET | ORAL | 0 refills | Status: DC

## 2021-05-02 NOTE — Addendum Note (Signed)
Addended by: Owens Loffler on: 05/02/2021 12:11 PM   Modules accepted: Orders

## 2021-05-02 NOTE — Telephone Encounter (Signed)
Ignore my last note.  What I meant to say is that she can stop the fluconazole and nystatin topical.  I sent her in some topical triamcinolone cream and a 1 pound jar, and also some oral prednisone.    Meds ordered this encounter  Medications   predniSONE (DELTASONE) 20 MG tablet    Sig: 2 tabs po daily for 5 days, then 1 tab po daily for 5 days    Dispense:  15 tablet    Refill:  0   triamcinolone cream (KENALOG) 0.1 %    Sig: Apply 1 application topically 2 (two) times daily.    Dispense:  454 g    Refill:  0

## 2021-05-02 NOTE — Telephone Encounter (Signed)
Patient saw Dr Lorelei Pont on 04/27/21 and was given medication that did not help at all, rash is spreading and is present on her back, stomach area and buttocks area, more on her arms, and legs.  No openings today or tomorrow so far. Any other recommendations patient can try or needs to come in to be re evaluated.

## 2021-05-02 NOTE — Telephone Encounter (Signed)
If she is not better at all, then I think that I was not right and this should not be from use.  I did send her in some oral prednisone, so I would use this on top of the topical nystatin that I gave to her.

## 2021-05-02 NOTE — Telephone Encounter (Signed)
[name] notified as instructed by telephone.  Patient states understanding.

## 2021-05-08 ENCOUNTER — Other Ambulatory Visit: Payer: Self-pay | Admitting: Family Medicine

## 2021-06-01 ENCOUNTER — Other Ambulatory Visit: Payer: Self-pay | Admitting: Family Medicine

## 2021-06-23 ENCOUNTER — Telehealth: Payer: Self-pay

## 2021-06-23 NOTE — Chronic Care Management (AMB) (Addendum)
Chronic Care Management Pharmacy Assistant   Name: Anita Cooper  MRN: [id number] DOB: October 02, 1951  Reason for Encounter: Diabetes, HTN Review  Recent office visits:  04/27/21 - Dr.Copland, Family Medicine - Patient presented for a rash on body. Started fluconazole 150 mg take 1 tablet every 3 days and nystatin powder apply topically 3 times daily.  Recent consult visits:  None since last CCM contact  Hospital visits:  None in previous 6 months  Medications: Outpatient Encounter Medications as of 06/23/2021  Medication Sig   aspirin 81 MG tablet Take 81 mg by mouth daily.   Blood Glucose Monitoring Suppl (ONE TOUCH ULTRA 2) w/Device KIT Use to check blood sugar once daily and as directed.  Diagnosis:  E11.9  Non insulin dependent.   esomeprazole (NEXIUM) 40 MG capsule TAKE 1 CAPSULE BY MOUTH EVERY DAY BEFORE BREAKFAST   fexofenadine (ALLEGRA) 180 MG tablet Take 180 mg by mouth daily.   fluconazole (DIFLUCAN) 150 MG tablet One tablet every 3 days   fluticasone (FLONASE) 50 MCG/ACT nasal spray USE 1-2 SPRAYS EACH NOSTRIL ONCE A DAY   ibuprofen (ADVIL) 200 MG tablet Take 400 mg by mouth every 6 (six) hours as needed.   metFORMIN (GLUCOPHAGE) 500 MG tablet TAKE UP TO 2 TABLETS TWICE A DAY.   Multiple Vitamin (MULTIVITAMIN) tablet Take 1 tablet by mouth daily.   nystatin (MYCOSTATIN/NYSTOP) powder Apply 1 application topically 3 (three) times daily.   olmesartan-hydrochlorothiazide (BENICAR HCT) 40-25 MG tablet TAKE 1/2 TABLETS BY MOUTH DAILY   predniSONE (DELTASONE) 20 MG tablet 2 tabs po daily for 5 days, then 1 tab po daily for 5 days   triamcinolone cream (KENALOG) 0.1 % Apply 1 application topically 2 (two) times daily.   No facility-administered encounter medications on file as of 06/23/2021.     Recent Relevant Labs: Lab Results  Component Value Date/Time   HGBA1C 6.5 (A) 03/28/2021 10:14 AM   HGBA1C 8.9 (A) 12/14/2020 09:39 AM   HGBA1C 8.2 (H) 09/09/2020 08:18 AM    HGBA1C 7.2 (H) 05/30/2019 08:06 AM    Kidney Function Lab Results  Component Value Date/Time   CREATININE 0.85 09/09/2020 08:18 AM   CREATININE 0.86 05/30/2019 08:06 AM   GFR 70.44 09/09/2020 08:18 AM   GFRNONAA 72.94 08/19/2010 11:06 AM   GFRAA  12/14/2009 03:45 PM    >60        The eGFR has been calculated using the MDRD equation. This calculation has not been validated in all clinical situations. eGFR's persistently <60 mL/min signify possible Chronic Kidney Disease.   Diabetes  Current antihyperglycemic regimen:  Metformin 553m - 2 tablets twice daily     Adherence Review: Is the patient currently on a STATIN medication? No Is the patient currently on ACE/ARB medication? Yes Does the patient have >5 day gap between last estimated fill dates? No  Care Gaps: Last annual wellness visit: 09/13/2020 Last eye exam / retinopathy screening: unknown Last diabetic foot exam: 09/13/2020   Star Rating Drugs:  Medication:  Last Fill: Day Supply Olmesartan/HCTZ       7/20//2022        90ds Metformin 5069m       06/01/2021         90ds    Recent Office Vitals: BP Readings from Last 3 Encounters:  04/27/21 138/68  03/28/21 124/80  12/14/20 124/76   Pulse Readings from Last 3 Encounters:  04/27/21 87  03/28/21 (!) 101  12/14/20 88  Wt Readings from Last 3 Encounters:  04/27/21 195 lb 8 oz (88.7 kg)  03/28/21 192 lb (87.1 kg)  12/14/20 200 lb (90.7 kg)     Kidney Function Lab Results  Component Value Date/Time   CREATININE 0.85 09/09/2020 08:18 AM   CREATININE 0.86 05/30/2019 08:06 AM   GFR 70.44 09/09/2020 08:18 AM   GFRNONAA 72.94 08/19/2010 11:06 AM   GFRAA  12/14/2009 03:45 PM    >60        The eGFR has been calculated using the MDRD equation. This calculation has not been validated in all clinical situations. eGFR's persistently <60 mL/min signify possible Chronic Kidney Disease.    BMP Latest Ref Rng & Units 09/09/2020 05/30/2019 05/20/2018   Glucose 70 - 99 mg/dL 164(H) 134(H) 209(H)  BUN 6 - 23 mg/dL 11 18 13   Creatinine 0.40 - 1.20 mg/dL 0.85 0.86 0.79  Sodium 135 - 145 mEq/L 138 138 139  Potassium 3.5 - 5.1 mEq/L 3.9 3.9 3.7  Chloride 96 - 112 mEq/L 97 96 100  CO2 19 - 32 mEq/L 31 33(H) 31  Calcium 8.4 - 10.5 mg/dL 9.8 9.8 9.4    Attempted contact with Anita Cooper 3 times on 06/23/21, 06/30/21, 07/06/21. Unsuccessful outreach. Will attempt contact next month.   Current antihypertensive regimen:  Olmesartan-hydrochlorothiazide (BENICAR HCT) 40-25 MG tablet - 1/2 tablet daily  Adherence Review: Is the patient currently on ACE/ARB medication? Yes Does the patient have >5 day gap between last estimated fill dates? No   Star Rating Drugs:  Medication:  Last Fill: Day Supply Olmesartan/HCTZ       7/20//2022        90ds Metformin 544m        06/01/2021         90ds   Care Gaps: Last annual wellness visit: 09/13/2020  Upcoming visits: PCP appointment on 10/04/21 and CCM appointment on 08/30/21   MDebbora Dus CPP notified  VAvel Sensor CGranvilleAssistant [phone] I have reviewed the care management and care coordination activities outlined in this encounter and I am certifying that I agree with the content of this note. No further action required.  MDebbora Dus PharmD Clinical Pharmacist [facility]elPrimary [facility] at [phone]

## 2021-07-12 DIAGNOSIS — M79645 Pain in left finger(s): Secondary | ICD-10-CM | POA: Diagnosis not present

## 2021-07-12 DIAGNOSIS — M1811 Unilateral primary osteoarthritis of first carpometacarpal joint, right hand: Secondary | ICD-10-CM | POA: Diagnosis not present

## 2021-07-12 DIAGNOSIS — M1812 Unilateral primary osteoarthritis of first carpometacarpal joint, left hand: Secondary | ICD-10-CM | POA: Diagnosis not present

## 2021-07-12 DIAGNOSIS — M13842 Other specified arthritis, left hand: Secondary | ICD-10-CM | POA: Diagnosis not present

## 2021-07-12 DIAGNOSIS — M65331 Trigger finger, right middle finger: Secondary | ICD-10-CM | POA: Diagnosis not present

## 2021-07-12 DIAGNOSIS — M13841 Other specified arthritis, right hand: Secondary | ICD-10-CM | POA: Diagnosis not present

## 2021-07-12 DIAGNOSIS — M180 Bilateral primary osteoarthritis of first carpometacarpal joints: Secondary | ICD-10-CM | POA: Diagnosis not present

## 2021-07-12 DIAGNOSIS — M79644 Pain in right finger(s): Secondary | ICD-10-CM | POA: Diagnosis not present

## 2021-08-10 DIAGNOSIS — M65331 Trigger finger, right middle finger: Secondary | ICD-10-CM | POA: Diagnosis not present

## 2021-08-10 DIAGNOSIS — M79645 Pain in left finger(s): Secondary | ICD-10-CM | POA: Diagnosis not present

## 2021-08-10 DIAGNOSIS — M79644 Pain in right finger(s): Secondary | ICD-10-CM | POA: Diagnosis not present

## 2021-08-10 DIAGNOSIS — M1812 Unilateral primary osteoarthritis of first carpometacarpal joint, left hand: Secondary | ICD-10-CM | POA: Diagnosis not present

## 2021-08-10 DIAGNOSIS — Z1231 Encounter for screening mammogram for malignant neoplasm of breast: Secondary | ICD-10-CM | POA: Diagnosis not present

## 2021-08-10 DIAGNOSIS — M13841 Other specified arthritis, right hand: Secondary | ICD-10-CM | POA: Diagnosis not present

## 2021-08-10 DIAGNOSIS — M13842 Other specified arthritis, left hand: Secondary | ICD-10-CM | POA: Diagnosis not present

## 2021-08-10 DIAGNOSIS — M180 Bilateral primary osteoarthritis of first carpometacarpal joints: Secondary | ICD-10-CM | POA: Diagnosis not present

## 2021-08-10 LAB — HM MAMMOGRAPHY

## 2021-08-23 ENCOUNTER — Telehealth: Payer: Self-pay

## 2021-08-23 NOTE — Progress Notes (Signed)
    Chronic Care Management Pharmacy Assistant   Name: Anita Cooper  MRN: [id number] DOB: 08/16/1951  Reason for Encounter: Reminder Call   Medications: Outpatient Encounter Medications as of 08/23/2021  Medication Sig   aspirin 81 MG tablet Take 81 mg by mouth daily.   Blood Glucose Monitoring Suppl (ONE TOUCH ULTRA 2) w/Device KIT Use to check blood sugar once daily and as directed.  Diagnosis:  E11.9  Non insulin dependent.   esomeprazole (NEXIUM) 40 MG capsule TAKE 1 CAPSULE BY MOUTH EVERY DAY BEFORE BREAKFAST   fexofenadine (ALLEGRA) 180 MG tablet Take 180 mg by mouth daily.   fluconazole (DIFLUCAN) 150 MG tablet One tablet every 3 days   fluticasone (FLONASE) 50 MCG/ACT nasal spray USE 1-2 SPRAYS EACH NOSTRIL ONCE A DAY   ibuprofen (ADVIL) 200 MG tablet Take 400 mg by mouth every 6 (six) hours as needed.   metFORMIN (GLUCOPHAGE) 500 MG tablet TAKE UP TO 2 TABLETS TWICE A DAY.   Multiple Vitamin (MULTIVITAMIN) tablet Take 1 tablet by mouth daily.   nystatin (MYCOSTATIN/NYSTOP) powder Apply 1 application topically 3 (three) times daily.   olmesartan-hydrochlorothiazide (BENICAR HCT) 40-25 MG tablet TAKE 1/2 TABLETS BY MOUTH DAILY   predniSONE (DELTASONE) 20 MG tablet 2 tabs po daily for 5 days, then 1 tab po daily for 5 days   triamcinolone cream (KENALOG) 0.1 % Apply 1 application topically 2 (two) times daily.   No facility-administered encounter medications on file as of 08/23/2021.   Unsuccessful attempt to contact [place]. Voicemail was left to remind her of her upcoming telephone visit with Debbora Dus on 08/30/2021 at 10:00 AM. Patient was reminded to have all medications, supplements and any blood glucose and blood pressure readings available for review at appointment.  Star Rating Drugs: Medication:  Last Fill: Day Supply Olmesartan/HCTZ       7/20//2022        90 Metformin 570m        06/01/2021         [place] CPP notified  AMarijean Niemann  [phone]  Time Spent: 10 Minutes

## 2021-08-29 ENCOUNTER — Telehealth: Payer: Self-pay | Admitting: Family Medicine

## 2021-08-29 NOTE — Telephone Encounter (Signed)
Pt called in stating that she and her husband Anita Cooper have in office appts with the provider this week and they would like to keep them instead of having virtual appt. Pt stated that she took 3 home covid test and they were all negative and pt stated her husband took 2 home covid test that were negative and one in the office on the 10/14. Pt stated she wanted provider to check her and husband to see if they have bronchitis or pneumonia and the provider would not be able to do that virtually.

## 2021-08-29 NOTE — Telephone Encounter (Signed)
FYI; Patient and husband have appts tomorrow at [facility] in the morning. They both cancelled the appts here.

## 2021-08-30 ENCOUNTER — Ambulatory Visit
Admission: RE | Admit: 2021-08-30 | Discharge: 2021-08-30 | Disposition: A | Discharge status: Home / Self Care | Payer: PPO | Source: Ambulatory Visit | Attending: Emergency Medicine | Admitting: Emergency Medicine

## 2021-08-30 ENCOUNTER — Telehealth: Payer: PPO

## 2021-08-30 ENCOUNTER — Other Ambulatory Visit: Payer: Self-pay

## 2021-08-30 VITALS — BP 148/75 | HR 96 | Temp 98.3°F | Resp 18

## 2021-08-30 DIAGNOSIS — J069 Acute upper respiratory infection, unspecified: Secondary | ICD-10-CM

## 2021-08-30 MED ORDER — PROMETHAZINE-PHENYLEPHRINE 6.25-5 MG/5ML PO SYRP: 5.0000 mL | ORAL_SOLUTION | Freq: Four times a day (QID) | ORAL | 0 refills | Status: DC | PRN

## 2021-08-30 MED ORDER — BENZONATATE 100 MG PO CAPS: 200.0000 mg | ORAL_CAPSULE | Freq: Three times a day (TID) | ORAL | 0 refills | Status: DC

## 2021-08-30 MED ORDER — ALBUTEROL SULFATE HFA 108 (90 BASE) MCG/ACT IN AERS: 2.0000 | INHALATION_SPRAY | RESPIRATORY_TRACT | 0 refills | Status: DC | PRN

## 2021-08-30 MED ORDER — IPRATROPIUM BROMIDE 0.06 % NA SOLN: 2.0000 | Freq: Four times a day (QID) | NASAL | 12 refills | Status: DC

## 2021-08-30 MED ORDER — AEROCHAMBER MV MISC: 2 refills | Status: DC

## 2021-08-30 NOTE — ED Provider Notes (Signed)
[facility]    CSN: [id number] Arrival date & time: 08/30/21  0945      History   Chief Complaint Chief Complaint  Patient presents with   Facial Pain   Otalgia   Cough    HPI Anita Cooper is a 70 y.o. female.   HPI  70 year old female here for evaluation of respiratory complaints.  Patient reports that for the last 5 days she has been experiencing nasal congestion with clear nasal discharge, sinus pressure and burning, sore throat, right ear pain, and a nonproductive cough.  For the last 4 days she has had a fever with a T-max of 100.8.  She denies any changes in hearing, ringing in her ears, dizziness, shortness of breath or wheezing, or GI complaints.  Patient was recently exposed to her granddaughter who has similar symptoms.  Past Medical History:  Diagnosis Date   Arthritis    Cancer [facility]) 2011 - Released by Dr. Jana Hakim 2012   stage 0 right breast S/P lumpectomy and sentinel node biopsy and radiation - per Dr. Donne Hazel.   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   Arthritis 08/19/2019   LFT elevation 06/04/2019   Advance care planning 05/26/2018   Left leg swelling 03/12/2018   Allergic rhinitis 01/29/2014   Diabetes mellitus without complication ([place]) 73/42/8768   Medicare annual wellness visit, subsequent 12/03/2012   History of breast cancer in female 08/07/2011   GERD (gastroesophageal reflux disease) 05/18/2011   HYPERCHOLESTEROLEMIA 08/31/2010   CARPAL TUNNEL SYNDROME 08/31/2010   HYPERTENSION, BENIGN 08/19/2010    Past Surgical History:  Procedure Laterality Date    BREAST LUMPECTOMY  2011   right lumpectomy, sentinel node biopsy   KNEE ARTHROSCOPY     Bilaterally    OB History   No obstetric history on file.      Home Medications    Prior to Admission medications   Medication Sig Start Date End Date Taking? Authorizing Provider  albuterol (VENTOLIN HFA) 108 (90 Base) MCG/ACT inhaler Inhale 2 puffs into the lungs every 4 (four) hours as needed. 08/30/21  Yes Margarette Canada, NP  benzonatate (TESSALON) 100 MG capsule Take 2 capsules (200 mg total) by mouth every 8 (eight) hours. 08/30/21  Yes Margarette Canada, NP  ipratropium (ATROVENT) 0.06 % nasal spray Place 2 sprays into both nostrils 4 (four) times daily. 08/30/21  Yes Margarette Canada, NP  promethazine-phenylephrine (PROMETHAZINE VC) 6.25-5 MG/5ML SYRP Take 5 mLs by mouth every 6 (six) hours as needed for congestion. 08/30/21  Yes Margarette Canada, NP  Spacer/Aero-Holding Chambers (AEROCHAMBER MV) inhaler Use as instructed 08/30/21  Yes Margarette Canada, NP  aspirin 81 MG tablet Take 81 mg by mouth daily.    [provider]  Blood Glucose Monitoring Suppl (ONE TOUCH ULTRA 2) w/Device KIT Use to check blood sugar once daily and as directed.  Diagnosis:  E11.9  Non insulin dependent. 06/06/18   Tonia Ghent, MD  esomeprazole (NEXIUM) 40 MG capsule TAKE 1 CAPSULE BY MOUTH EVERY DAY BEFORE BREAKFAST 06/01/21   Tonia Ghent, MD  fexofenadine (ALLEGRA) 180 MG tablet Take 180 mg by mouth daily.    [provider]  fluconazole (DIFLUCAN) 150 MG tablet One tablet every 3 days 04/27/21   Copland, Frederico Hamman, MD  fluticasone [facility]) 50 MCG/ACT nasal spray USE 1-2 SPRAYS EACH NOSTRIL ONCE A DAY 05/09/21   Tonia Ghent, MD  ibuprofen (ADVIL) 200 MG tablet Take 400 mg by mouth every 6 (six) hours as needed.    [provider]  metFORMIN (GLUCOPHAGE) 500 MG tablet TAKE UP TO 2 TABLETS TWICE A DAY. 06/01/21   Tonia Ghent, MD  Multiple Vitamin (MULTIVITAMIN) tablet Take 1 tablet by mouth  daily.    [provider]  nystatin (MYCOSTATIN/NYSTOP) powder Apply 1 application topically 3 (three) times daily. 04/27/21   Copland, Frederico Hamman, MD  olmesartan-hydrochlorothiazide (BENICAR HCT) 40-25 MG tablet TAKE 1/2 TABLETS BY MOUTH DAILY 06/01/21   Tonia Ghent, MD  predniSONE (DELTASONE) 20 MG tablet 2 tabs po daily for 5 days, then 1 tab po daily for 5 days 05/02/21   Copland, Frederico Hamman, MD  triamcinolone cream (KENALOG) 0.1 % Apply 1 application topically 2 (two) times daily. 05/02/21   Copland, Frederico Hamman, MD    Family History Family History  Problem Relation Age of Onset   Cancer Father        Lung   Breast cancer Daughter    Colon cancer Neg Hx     Social History Social History   Tobacco Use   Smoking status: Never   Smokeless tobacco: Never  Vaping Use   Vaping Use: Never used  Substance Use Topics   Alcohol use: No    Alcohol/week: 0.0 standard drinks   Drug use: No     Allergies   Oseltamivir, Benadryl [diphenhydramine hcl], Losartan, and Pravastatin   Review of Systems Review of Systems  Constitutional:  Positive for fever. Negative for activity change and appetite change.  HENT:  Positive for congestion, ear pain, postnasal drip, rhinorrhea, sinus pain and sore throat.   Respiratory:  Positive for cough. Negative for shortness of breath and wheezing.   Gastrointestinal:  Negative for diarrhea, nausea and vomiting.  Skin:  Negative for rash.  Hematological: Negative.   Psychiatric/Behavioral: Negative.      Physical Exam Triage Vital Signs ED Triage Vitals  Enc Vitals Group     BP 08/30/21 1040 (!) 148/75     Pulse Rate 08/30/21 1040 96     Resp 08/30/21 1040 18     Temp 08/30/21 1040 98.3 F (36.8 C)     Temp Source 08/30/21 1040 Oral     SpO2 08/30/21 1040 96 %     Weight --      Height --      Head Circumference --      Peak Flow --      Pain Score 08/30/21 1038 4     Pain Loc --      Pain Edu? --      Excl. in [place]? --    No data  found.  Updated Vital Signs BP (!) 148/75 (BP Location: Left Arm)   Pulse 96   Temp 98.3 F (36.8 C) (Oral)   Resp 18   SpO2 96%   Visual Acuity Right Eye Distance:   Left Eye Distance:   Bilateral Distance:    Right Eye Near:   Left Eye Near:    Bilateral Near:     Physical Exam Vitals and nursing note reviewed.  Constitutional:  General: She is not in acute distress.    Appearance: Normal appearance. She is not ill-appearing.  HENT:     Head: Normocephalic and atraumatic.     Right Ear: Tympanic membrane, ear canal and external ear normal. There is no impacted cerumen.     Left Ear: Tympanic membrane, ear canal and external ear normal. There is no impacted cerumen.     Nose: Congestion and rhinorrhea present.     Mouth/Throat:     Mouth: Mucous membranes are moist.     Pharynx: Oropharynx is clear. Posterior oropharyngeal erythema present.  Cardiovascular:     Rate and Rhythm: Normal rate and regular rhythm.     Pulses: Normal pulses.     Heart sounds: Normal heart sounds. No murmur heard.   No gallop.  Pulmonary:     Effort: Pulmonary effort is normal.     Breath sounds: Wheezing present. No rhonchi or rales.  Musculoskeletal:     Cervical back: Normal range of motion and neck supple.  Lymphadenopathy:     Cervical: No cervical adenopathy.  Skin:    General: Skin is warm and dry.     Capillary Refill: Capillary refill takes less than 2 seconds.     Findings: No erythema or rash.  Neurological:     General: No focal deficit present.     Mental Status: She is alert and oriented to person, place, and time.  Psychiatric:        Mood and Affect: Mood normal.        Behavior: Behavior normal.        Thought Content: Thought content normal.        Judgment: Judgment normal.     UC Treatments / Results  Labs (all labs ordered are listed, but only abnormal results are displayed) Labs Reviewed - No data to display  EKG   Radiology No results  found.  Procedures Procedures (including critical care time)  Medications Ordered in UC Medications - No data to display  Initial Impression / Assessment and Plan / UC Course  I have reviewed the triage vital signs and the nursing notes.  Pertinent labs & imaging results that were available during my care of the patient were reviewed by me and considered in my medical decision making (see chart for details).  Pleasant, nontoxic-appearing 70 year old female here for evaluation of respiratory complaints as outlined in the HPI above.  Patient's physical exam reveals pearly gray tympanic membranes bilaterally with a normal light reflex and clear external auditory canals.  Nasal mucosa is erythematous and edematous with clear nasal discharge in both nares.  There is no tenderness to percussion of maxillary or frontal sinuses bilaterally.  Oropharyngeal exam reveals mild posterior oropharyngeal erythema with clear postnasal drip.  No cervical lymphadenopathy appreciated on exam.  Cardiopulmonary exam reveals scattered expiratory wheezes in bilateral upper lung fields.  The remainder of the lung fields are clear to auscultation.  Patient's exam is consistent with a viral URI with cough.  We will treat patient symptomatically with Atrovent nasal spray to help with nasal congestion, Tessalon Perles and Promethazine VC cough syrup to help with cough and congestion.  I have also prescribed an albuterol inhaler patient can use as needed for cough and wheezing.   Final Clinical Impressions(s) / UC Diagnoses   Final diagnoses:  Viral URI with cough     Discharge Instructions      Use the Atrovent nasal spray, 2 squirts in each nostril every 6  hours, as needed for runny nose and postnasal drip.  Use the Tessalon Perles every 8 hours during the day.  Take them with a small sip of water.  They may give you some numbness to the base of your tongue or a metallic taste in your mouth, this is normal.  Use  the Promethazine VC cough syrup at bedtime for cough and congestion.  It will make you drowsy so do not take it during the day.  Use the Albuterol inhaler with spacer, 2 puffs every 4-6 hours, as needed for cough and wheezing.   Return for reevaluation or see your primary care provider for any new or worsening symptoms.      ED Prescriptions     Medication Sig Dispense Auth. Provider   benzonatate (TESSALON) 100 MG capsule Take 2 capsules (200 mg total) by mouth every 8 (eight) hours. 21 capsule Margarette Canada, NP   ipratropium (ATROVENT) 0.06 % nasal spray Place 2 sprays into both nostrils 4 (four) times daily. 15 mL Margarette Canada, NP   promethazine-phenylephrine (PROMETHAZINE VC) 6.25-5 MG/5ML SYRP Take 5 mLs by mouth every 6 (six) hours as needed for congestion. 118 mL Margarette Canada, NP   albuterol (VENTOLIN HFA) 108 (90 Base) MCG/ACT inhaler Inhale 2 puffs into the lungs every 4 (four) hours as needed. 18 g Margarette Canada, NP   Spacer/Aero-Holding Chambers (AEROCHAMBER MV) inhaler Use as instructed 1 each Margarette Canada, NP      PDMP not reviewed this encounter.   Margarette Canada, NP 08/30/21 1128

## 2021-08-30 NOTE — ED Triage Notes (Signed)
Pt presents today with c/o of nasal/sinus pressure, right ear pain and cough with low grade temp x 5 days. Home Covid test x 2 neg.

## 2021-08-30 NOTE — Discharge Instructions (Addendum)
Use the Atrovent nasal spray, 2 squirts in each nostril every 6 hours, as needed for runny nose and postnasal drip.  Use the Tessalon Perles every 8 hours during the day.  Take them with a small sip of water.  They may give you some numbness to the base of your tongue or a metallic taste in your mouth, this is normal.  Use the Promethazine VC cough syrup at bedtime for cough and congestion.  It will make you drowsy so do not take it during the day.  Use the Albuterol inhaler with spacer, 2 puffs every 4-6 hours, as needed for cough and wheezing.   Return for reevaluation or see your primary care provider for any new or worsening symptoms.

## 2021-09-01 ENCOUNTER — Ambulatory Visit (INDEPENDENT_AMBULATORY_CARE_PROVIDER_SITE_OTHER): Payer: PPO | Admitting: Primary Care

## 2021-09-01 ENCOUNTER — Other Ambulatory Visit: Payer: Self-pay

## 2021-09-01 ENCOUNTER — Encounter: Payer: Self-pay | Admitting: Primary Care

## 2021-09-01 ENCOUNTER — Telehealth: Payer: PPO | Admitting: Family Medicine

## 2021-09-01 ENCOUNTER — Ambulatory Visit
Admission: RE | Admit: 2021-09-01 | Discharge: 2021-09-01 | Disposition: A | Discharge status: Home / Self Care | Payer: PPO | Source: Ambulatory Visit | Attending: Primary Care | Admitting: Primary Care

## 2021-09-01 VITALS — BP 146/80 | HR 90 | Temp 98.7°F | Ht 61.0 in | Wt 196.0 lb

## 2021-09-01 DIAGNOSIS — R221 Localized swelling, mass and lump, neck: Secondary | ICD-10-CM | POA: Insufficient documentation

## 2021-09-01 DIAGNOSIS — E042 Nontoxic multinodular goiter: Secondary | ICD-10-CM | POA: Diagnosis not present

## 2021-09-01 DIAGNOSIS — R590 Localized enlarged lymph nodes: Secondary | ICD-10-CM | POA: Diagnosis not present

## 2021-09-01 MED ORDER — KETOROLAC TROMETHAMINE 60 MG/2ML IM SOLN
60.0000 mg | Freq: Once | INTRAMUSCULAR | Status: AC
  Administered 2021-09-01: 60 mg via INTRAMUSCULAR

## 2021-09-01 NOTE — Progress Notes (Signed)
Subjective:    Patient ID: Anita Cooper, female    DOB: 1951/06/26, 70 y.o.   MRN: [id number]  HPI  Anita Cooper is a very pleasant 70 y.o. female patient of Dr. Damita Dunnings with a history of type 2 diabetes, breast cancer, carpal tunnel syndrome who presents today to discuss facial swelling.   Evaluated at Urgent Care on 08/30/21 for symptoms of facial pain, sinus pressure, ear pain, low grade fever, cough that began five days ago. Exposed to granddaughter who had similar symptoms. During urgent care visit she was treated for viral URI with Atrovent, Tessalon Perles, cough suppressant, albuterol inhaler.  Today she endorses right sided neck swelling and pain that she first noticed yesterday. She woke up this morning and noticed a significant increase in size and pain.  Also with continued cough, but overall improved.   Last fever was three days ago. Sinus pressure has improved, denies sore throat, and is feeling much better than she did a few days ago. She has been tested negative for Covid-19 several times, home tests. Husband has tested negative for Covid-19.    Review of Systems  Constitutional:  Negative for chills, fatigue and fever.  HENT:  Negative for congestion, postnasal drip, sinus pressure and trouble swallowing.   Respiratory:  Positive for cough. Negative for shortness of breath.   Skin:  Negative for color change.  Hematological:  Positive for adenopathy.        Past Medical History:  Diagnosis Date   Arthritis    Cancer [facility]) 2011 - Released by Dr. Jana Hakim 2012   stage 0 right breast S/P lumpectomy and sentinel node biopsy and radiation - per Dr. Donne Hazel.   Carpal tunnel syndrome    Diabetes mellitus without complication (HCC)    Elevated liver function tests    Mild increase due to fatty liver seen on [place], normal after weight loss   GERD (gastroesophageal reflux disease)    Lack of relief with Prilosec and other OTC PPI's   Hyperlipidemia    Hypertension     Benign    Social History   Socioeconomic History   Marital status: Married    Spouse name: Not on file   Number of children: 2   Years of education: Not on file   Highest education level: Not on file  Occupational History   Occupation: Full time  Tobacco Use   Smoking status: Never   Smokeless tobacco: Never  Vaping Use   Vaping Use: Never used  Substance and Sexual Activity   Alcohol use: No    Alcohol/week: 0.0 standard drinks   Drug use: No   Sexual activity: Not on file  Other Topics Concern   Not on file  Social History Narrative   Reads for pleasure.   2 kids, 5 grandkids   Retired 2015   Married 1973   Social Determinants of Radio broadcast assistant Strain: Low Risk    Difficulty of Paying Living Expenses: Not very hard  Food Insecurity: Not on file  Transportation Needs: Not on file  Physical Activity: Not on file  Stress: Not on file  Social Connections: Not on file  Intimate Partner Violence: Not on file    Past Surgical History:  Procedure Laterality Date   BREAST LUMPECTOMY  2011   right lumpectomy, sentinel node biopsy   KNEE ARTHROSCOPY     Bilaterally    Family History  Problem Relation Age of Onset   Cancer Father  Lung   Breast cancer Daughter    Colon cancer Neg Hx     Allergies  Allergen Reactions   Oseltamivir Nausea Only    Other reaction(s): Insomnia   Benadryl [Diphenhydramine Hcl]     Insomnia and headache   Losartan     Presumed cause of itching, tolerated benicar   Pravastatin     Muscle aches.      Current Outpatient Medications on File Prior to Visit  Medication Sig Dispense Refill   albuterol (VENTOLIN HFA) 108 (90 Base) MCG/ACT inhaler Inhale 2 puffs into the lungs every 4 (four) hours as needed. 18 g 0   aspirin 81 MG tablet Take 81 mg by mouth daily.     benzonatate (TESSALON) 100 MG capsule Take 2 capsules (200 mg total) by mouth every 8 (eight) hours. 21 capsule 0   Blood Glucose Monitoring Suppl  (ONE TOUCH ULTRA 2) w/Device KIT Use to check blood sugar once daily and as directed.  Diagnosis:  E11.9  Non insulin dependent. 1 each 0   esomeprazole (NEXIUM) 40 MG capsule TAKE 1 CAPSULE BY MOUTH EVERY DAY BEFORE BREAKFAST 90 capsule 1   fexofenadine (ALLEGRA) 180 MG tablet Take 180 mg by mouth daily.     fluticasone (FLONASE) 50 MCG/ACT nasal spray USE 1-2 SPRAYS EACH NOSTRIL ONCE A DAY 48 mL 2   ibuprofen (ADVIL) 200 MG tablet Take 400 mg by mouth every 6 (six) hours as needed.     metFORMIN (GLUCOPHAGE) 500 MG tablet TAKE UP TO 2 TABLETS TWICE A DAY. 360 tablet 3   Multiple Vitamin (MULTIVITAMIN) tablet Take 1 tablet by mouth daily.     olmesartan-hydrochlorothiazide (BENICAR HCT) 40-25 MG tablet TAKE 1/2 TABLETS BY MOUTH DAILY 45 tablet 1   promethazine-phenylephrine (PROMETHAZINE VC) 6.25-5 MG/5ML SYRP Take 5 mLs by mouth every 6 (six) hours as needed for congestion. 118 mL 0   triamcinolone cream (KENALOG) 0.1 % Apply 1 application topically 2 (two) times daily. 454 g 0   ipratropium (ATROVENT) 0.06 % nasal spray Place 2 sprays into both nostrils 4 (four) times daily. (Patient not taking: Reported on 09/01/2021) 15 mL 12   No current facility-administered medications on file prior to visit.    BP (!) 146/80   Pulse 90   Temp 98.7 F (37.1 C) (Temporal)   Ht _0  (1.549 m)   Wt 196 lb (88.9 kg)   SpO2 98%   BMI 37.03 kg/m  Objective:   Physical Exam HENT:     Right Ear: Tympanic membrane and ear canal normal.     Left Ear: Tympanic membrane and ear canal normal.     Nose:     Right Sinus: No maxillary sinus tenderness or frontal sinus tenderness.     Left Sinus: No maxillary sinus tenderness or frontal sinus tenderness.     Mouth/Throat:     Mouth: Mucous membranes are moist. No oral lesions.     Dentition: Normal dentition. No dental tenderness.     Palate: No mass.     Pharynx: Oropharynx is clear.     Tonsils: No tonsillar exudate or tonsillar abscesses.  Eyes:      Conjunctiva/sclera: Conjunctivae normal.  Neck:     Thyroid: No thyroid mass, thyromegaly or thyroid tenderness.     Comments: Moderately sized, what appears to be reactive lymph node, to right lateral cervical chain just under jaw. Tender. No erythema.  Cardiovascular:     Rate and Rhythm: Normal rate and  regular rhythm.  Pulmonary:     Effort: Pulmonary effort is normal.     Breath sounds: Normal breath sounds. No wheezing or rales.  Musculoskeletal:     Cervical back: Neck supple.  Lymphadenopathy:     Cervical: Cervical adenopathy present.  Skin:    General: Skin is warm and dry.          Assessment & Plan:      This visit occurred during the SARS-CoV-2 public health emergency.  Safety protocols were in place, including screening questions prior to the visit, additional usage of staff PPE, and extensive cleaning of exam room while observing appropriate contact time as indicated for disinfecting solutions.

## 2021-09-01 NOTE — Assessment & Plan Note (Signed)
Suspicious.  Could very well be reactionary lymph node given recent viral illness, however, she's feeling much better and cold symptoms have mostly resolved.  Given history of breast cancer, coupled with presentation, will obtain stat ultrasound of her neck, non thyroid. IM Toradol 60 mg provided today.   Await results.  She appears very stable, is in no distress.

## 2021-09-01 NOTE — Patient Instructions (Addendum)
Complete the ultrasound at the [facility] in through the medical mall entrance and check in with the Radiology desk.  I will be in touch with results once received.  It was a pleasure meeting you!

## 2021-09-02 ENCOUNTER — Ambulatory Visit
Admission: RE | Admit: 2021-09-02 | Discharge: 2021-09-02 | Disposition: A | Discharge status: Home / Self Care | Payer: PPO | Source: Ambulatory Visit | Attending: Emergency Medicine | Admitting: Emergency Medicine

## 2021-09-02 ENCOUNTER — Ambulatory Visit
Admission: RE | Admit: 2021-09-02 | Discharge: 2021-09-02 | Disposition: A | Discharge status: Home / Self Care | Payer: PPO | Attending: Emergency Medicine | Admitting: Emergency Medicine

## 2021-09-02 ENCOUNTER — Telehealth: Payer: Self-pay | Admitting: Family Medicine

## 2021-09-02 VITALS — BP 164/94 | HR 64 | Temp 99.0°F | Resp 20

## 2021-09-02 DIAGNOSIS — R918 Other nonspecific abnormal finding of lung field: Secondary | ICD-10-CM | POA: Diagnosis not present

## 2021-09-02 DIAGNOSIS — J029 Acute pharyngitis, unspecified: Secondary | ICD-10-CM | POA: Diagnosis not present

## 2021-09-02 DIAGNOSIS — R0989 Other specified symptoms and signs involving the circulatory and respiratory systems: Secondary | ICD-10-CM | POA: Diagnosis not present

## 2021-09-02 DIAGNOSIS — R059 Cough, unspecified: Secondary | ICD-10-CM | POA: Diagnosis not present

## 2021-09-02 DIAGNOSIS — R053 Chronic cough: Secondary | ICD-10-CM | POA: Diagnosis not present

## 2021-09-02 DIAGNOSIS — R509 Fever, unspecified: Secondary | ICD-10-CM | POA: Diagnosis not present

## 2021-09-02 DIAGNOSIS — H66001 Acute suppurative otitis media without spontaneous rupture of ear drum, right ear: Secondary | ICD-10-CM | POA: Diagnosis not present

## 2021-09-02 DIAGNOSIS — R590 Localized enlarged lymph nodes: Secondary | ICD-10-CM | POA: Diagnosis not present

## 2021-09-02 DIAGNOSIS — J209 Acute bronchitis, unspecified: Secondary | ICD-10-CM | POA: Diagnosis not present

## 2021-09-02 LAB — POCT MONO SCREEN (KUC): Mono, POC: NEGATIVE

## 2021-09-02 MED ORDER — AMOXICILLIN-POT CLAVULANATE 875-125 MG PO TABS: 1.0000 | ORAL_TABLET | Freq: Two times a day (BID) | ORAL | 0 refills | Status: DC

## 2021-09-02 NOTE — Telephone Encounter (Signed)
Pt returning call

## 2021-09-02 NOTE — ED Triage Notes (Signed)
Pt reports having neck swelling to right side that is painful. She reports getting an xray done that revealed a swollen lymph nose per patient.

## 2021-09-02 NOTE — Telephone Encounter (Signed)
Left message to return call to our office.  

## 2021-09-02 NOTE — Telephone Encounter (Signed)
Called l/m to call office need more information.

## 2021-09-02 NOTE — Telephone Encounter (Signed)
Called patient states that it feels like it has gotten bigger and is now more in jaw area. It is tender to touch. She denies any redness, warm to touch and has not gotten any harder. She took 400mg  ibuprofen last night and did not notice any improvement. She also tried warm compresses and ice pack with no improvement. Wanted to know if abx needed to be sent in?   Uses [facility].   Allergies  Allergen Reactions   Oseltamivir Nausea Only    Other reaction(s): Insomnia   Benadryl [Diphenhydramine Hcl]     Insomnia and headache   Losartan     Presumed cause of itching, tolerated benicar   Pravastatin     Muscle aches.

## 2021-09-02 NOTE — ED Provider Notes (Signed)
[facility]    CSN: [id number] Arrival date & time: 09/02/21  1200      History   Chief Complaint Chief Complaint  Patient presents with   Neck Pain    HPI Anita Cooper is a 70 y.o. female.   Pt reports having neck swelling to right side that is painful. She reports getting an neck ultrasound done this morning, radiology report reviewed by me, the lymph node was hypoechoic and felt to be benign.  Patient states that the nurse practitioner at her primary care provider practice prescribed her steroids 2 weeks ago for this issue, states her symptoms of not improved and, in fact, feels that the swelling of note is actually gotten larger.  Patient states that she feels at this point, she needs antibiotics.Patient also complains of cough that has become more productive p since it started 2 weeks ago.  Patient's blood pressure is elevated in clinic today, patient states she is never seen that high before.  Patient also complains of fatigue but adds that she feels it is due to coughing, and not sleeping well and just being sick for the past few weeks.  Patient denies fever, aches, chills, nausea, vomiting, diarrhea, sore throat, hoarse voice, loss of taste or smell.  Patient states that her husband at home has the exact same symptoms with the exception of the swollen lymph node.  Patient reports negative home COVID test 2 days ago.  The history is provided by the patient.   Past Medical History:  Diagnosis Date   Arthritis    Cancer [facility]) 2011 - Released by Dr. Jana Hakim 2012   stage 0 right breast S/P lumpectomy and sentinel node biopsy and radiation - per Dr. Donne Hazel.   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   Arthritis 08/19/2019   LFT elevation 06/04/2019   Advance care planning 05/26/2018   Left leg swelling 03/12/2018   Allergic rhinitis 01/29/2014   Diabetes mellitus without complication ([place]) 16/60/6301   Medicare annual wellness visit, subsequent 12/03/2012   History of breast cancer in female 08/07/2011   GERD (gastroesophageal reflux disease) 05/18/2011   HYPERCHOLESTEROLEMIA 08/31/2010   CARPAL TUNNEL SYNDROME 08/31/2010   HYPERTENSION, BENIGN 08/19/2010    Past Surgical History:  Procedure Laterality Date   BREAST LUMPECTOMY  2011   right lumpectomy, sentinel node biopsy   KNEE ARTHROSCOPY     Bilaterally    OB History   No obstetric history on file.      Home Medications    Prior to Admission medications   Medication Sig Start Date End Date Taking? Authorizing Provider  amoxicillin-clavulanate (AUGMENTIN) 875-125 MG tablet Take 1 tablet by mouth every 12 (twelve) hours. 09/02/21  Yes Lynden Oxford Scales, PA-C  albuterol (VENTOLIN HFA) 108 (90 Base) MCG/ACT inhaler Inhale 2 puffs into the lungs every 4 (four) hours as needed. 08/30/21   Margarette [place], NP  aspirin 81 MG tablet Take 81 mg by mouth daily.    [provider]  benzonatate (  TESSALON) 100 MG capsule Take 2 capsules (200 mg total) by mouth every 8 (eight) hours. 08/30/21   Margarette [place], NP  Blood Glucose Monitoring Suppl (ONE TOUCH ULTRA 2) w/Device KIT Use to check blood sugar once daily and as directed.  Diagnosis:  E11.9  Non insulin dependent. 06/06/18   Tonia Ghent, MD  esomeprazole (NEXIUM) 40 MG capsule TAKE 1 CAPSULE BY MOUTH EVERY DAY BEFORE BREAKFAST 06/01/21   Tonia Ghent, MD  fexofenadine (ALLEGRA) 180 MG tablet Take 180 mg by mouth daily.    [provider]  fluticasone (FLONASE) 50 MCG/ACT nasal spray USE 1-2 SPRAYS  EACH NOSTRIL ONCE A DAY 05/09/21   Tonia Ghent, MD  ibuprofen (ADVIL) 200 MG tablet Take 400 mg by mouth every 6 (six) hours as needed.    [provider]  ipratropium (ATROVENT) 0.06 % nasal spray Place 2 sprays into both nostrils 4 (four) times daily. 08/30/21   Margarette [place], NP  metFORMIN (GLUCOPHAGE) 500 MG tablet TAKE UP TO 2 TABLETS TWICE A DAY. 06/01/21   Tonia Ghent, MD  Multiple Vitamin (MULTIVITAMIN) tablet Take 1 tablet by mouth daily.    [provider]  olmesartan-hydrochlorothiazide (BENICAR HCT) 40-25 MG tablet TAKE 1/2 TABLETS BY MOUTH DAILY 06/01/21   Tonia Ghent, MD  promethazine-phenylephrine (PROMETHAZINE VC) 6.25-5 MG/5ML SYRP Take 5 mLs by mouth every 6 (six) hours as needed for congestion. 08/30/21   Margarette [place], NP  triamcinolone cream (KENALOG) 0.1 % Apply 1 application topically 2 (two) times daily. 05/02/21   CoplandFrederico Hamman, MD    Family History Family History  Problem Relation Age of Onset   Cancer Father        Lung   Breast cancer Daughter    Colon cancer Neg Hx     Social History Social History   Tobacco Use   Smoking status: Never   Smokeless tobacco: Never  Vaping Use   Vaping Use: Never used  Substance Use Topics   Alcohol use: No    Alcohol/week: 0.0 standard drinks   Drug use: No     Allergies   Oseltamivir, Benadryl [diphenhydramine hcl], Losartan, and Pravastatin   Review of Systems Review of Systems Pertinent findings noted in history of present illness.    Physical Exam Triage Vital Signs ED Triage Vitals  Enc Vitals Group     BP      Pulse      Resp      Temp      Temp src      SpO2      Weight      Height      Head Circumference      Peak Flow      Pain Score      Pain Loc      Pain Edu?      Excl. in [place]?    No data found.  Updated Vital Signs BP (!) 164/94 (BP Location: Right Arm)   Pulse 64   Temp 99 F (37.2 C) (Oral)   Resp 20   SpO2 98%   Visual Acuity Right Eye  Distance:   Left Eye Distance:   Bilateral Distance:    Right Eye Near:   Left Eye Near:    Bilateral Near:     Physical Exam Vitals and nursing note reviewed.  Constitutional:      Appearance: Normal appearance.  HENT:     Head: Normocephalic and atraumatic.  Right Ear: Tympanic membrane, ear canal and external ear normal.     Left Ear: Tympanic membrane, ear canal and external ear normal.     Nose: Nose normal.     Mouth/Throat:     Mouth: Mucous membranes are moist.     Pharynx: Oropharynx is clear.  Eyes:     Extraocular Movements: Extraocular movements intact.     Conjunctiva/sclera: Conjunctivae normal.     Pupils: Pupils are equal, round, and reactive to light.  Neck:     Comments: Right submandibular lymph node enlargement. Cardiovascular:     Rate and Rhythm: Normal rate and regular rhythm.     Pulses: Normal pulses.     Heart sounds: Normal heart sounds.  Pulmonary:     Effort: Pulmonary effort is normal. No respiratory distress.     Breath sounds: Normal breath sounds. No stridor. No wheezing, rhonchi or rales.  Chest:     Chest wall: No tenderness.  Musculoskeletal:        General: Normal range of motion.     Cervical back: Normal range of motion and neck supple.  Lymphadenopathy:     Cervical: Cervical adenopathy present.     Right cervical: Superficial cervical adenopathy present.     Left cervical: Superficial cervical adenopathy present.  Skin:    General: Skin is warm and dry.     Capillary Refill: Capillary refill takes less than 2 seconds.  Neurological:     General: No focal deficit present.     Mental Status: She is alert and oriented to person, place, and time. Mental status is at baseline.  Psychiatric:        Mood and Affect: Mood normal.        Behavior: Behavior normal.     UC Treatments / Results  Labs (all labs ordered are listed, but only abnormal results are displayed) Labs Reviewed  CULTURE, GROUP A STREP ([place])  COVID-19,  FLU A+B AND RSV  POCT MONO SCREEN [facility])    EKG   Radiology DG Chest 2 View  Result Date: 09/02/2021 CLINICAL DATA:  Productive cough for 3 weeks. Decreased breath sounds right middle lobe. EXAM: CHEST - 2 VIEW COMPARISON:  Chest radiograph 12/14/2009 FINDINGS: Slight patient rotation. Normal heart size. Slight aortic tortuosity and atherosclerosis with otherwise normal mediastinal contours. Questionable smoothly marginated rounded nodular density in the medial left upper lung measuring 15 x 17 mm, possibly projecting posteriorly on the lateral view over the upper thoracic spine. No confluent consolidation or evidence of pneumonia. No pulmonary edema. No pleural effusion or pneumothorax. Right breast/axillary surgical clips. Thoracic spondylosis with endplate spurring. IMPRESSION: 1. No acute chest finding. No evidence of pneumonia or right middle lobe opacity. 2. Questionable rounded nodular density in the medial left upper lung measuring 15 x 17 mm, possibly projecting posteriorly on the lateral view. Recommend further evaluation with chest CT, with IV contrast in the absence of contraindications. Electronically Signed   By: Keith Rake M.D.   On: 09/02/2021 17:19   US SOFT TISSUE HEAD & NECK (NON-THYROID)  Result Date: 09/01/2021 CLINICAL DATA:  Right submandibular palpable nodules, concern for adenopathy EXAM: ULTRASOUND OF HEAD/NECK SOFT TISSUES TECHNIQUE: Ultrasound examination of the head and neck soft tissues was performed in the area of clinical concern. COMPARISON:  None. FINDINGS: Ultrasound performed of the right submandibular area of concern. In this region, there superficial prominent but benign-appearing right submandibular lymph nodes. Two adjacent lymph nodes noted measuring up to 1.1 cm  in length and only 0.5 cm in short axis. Lymph nodes have preserved hypoechoic cortex and fatty hila. No other regional soft tissue abnormality, mass, cyst or fluid collection. IMPRESSION:  Benign-appearing right submandibular lymph nodes. Electronically Signed   By: Jerilynn Mages.  Shick M.D.   On: 09/01/2021 14:36    Procedures Procedures (including critical care time)  Medications Ordered in UC Medications - No data to display  Initial Impression / Assessment and Plan / UC Course  I have reviewed the triage vital signs and the nursing notes.  Pertinent labs & imaging results that were available during my care of the patient were reviewed by me and considered in my medical decision making (see chart for details).     Patient's Monospot test was negative, COVID/influenza/RSV tests and throat culture were performed, patient advised to be notified of results once received.  I do agree with patient that at this point, she likely needs to be treated empirically for some sort of upper respiratory bacterial infection given the duration and worsening rather than improvement of her symptoms.  Augmentin prescribed.  Patient was given return precautions, continued conservative care also recommended.  Date  Patient verbalized understanding and agreement of plan as discussed.  All questions were addressed during visit.  Please see discharge instructions below for further details of plan.  Final Clinical Impressions(s) / UC Diagnoses   Final diagnoses:  Fever, unspecified  Acute pharyngitis, unspecified etiology  Acute suppurative otitis media of right ear without spontaneous rupture of tympanic membrane, recurrence not specified  Acute bronchitis, unspecified organism  Lymphadenopathy, submandibular     Discharge Instructions      Today you were tested for COVID, influenza, respiratory syncytial virus, streptococcal pharyngitis and infectious mononucleosis.    There is a very small percentage of the adult population in the [place] that is diagnosed with infectious mononucleosis, approximately 2%.  The reason IM occurs in adults is due to a lack of exposure at a younger age, which is most common  for the child and adolescent population, approximately 98%.  The treatment for COVID, flu, RSV and mono is supportive as I am sure you are aware.  Streptococcal pharyngitis is treated with antibiotics.  My physical exam findings today are concerning for acute infectious otitis media in your right ear.  Therefore, I recommend that you begin Augmentin to treat this.  I sent a 10-day course to your pharmacy, please take 1 tablet twice a day.  If your throat culture is positive for streptococcal pharyngitis, Augmentin will treat this.  If your lingering cough is due to a bacterial superinfection, 95% of cases of bronchitis are initially due to to viral infection but after 2 to 3 weeks they can evolve into a bacterial bronchitis. Augmentin will treat this as well.  While your blood pressure today is elevated, and that is a conversation you should have with your primary care provider, your oxygen level is stable and your heart rate is very good.  I do not believe that you are exhibiting any signs of respiratory distress or acute obstructive lung disease so I do not believe that you need any steroids at this time.  I took the liberty reading through the result of your neck ultrasound.  What they found is that your lymph nodes are reactive which simply means they are enlarged because you have been sick.  It is my hope that a 10-day course of a strong antibiotic will resolve your illness and your lymph nodes will return  to normal size.  That being said, if this is not the magic bullet, I want you to go ahead and make an appointment with your primary care provider today to be seen 7 to 10 days from now in the event that the antibiotic is not the answer.       ED Prescriptions     Medication Sig Dispense Auth. Provider   amoxicillin-clavulanate (AUGMENTIN) 875-125 MG tablet Take 1 tablet by mouth every 12 (twelve) hours. 14 tablet Lynden Oxford Scales, PA-C      PDMP not reviewed this encounter.    Lynden Oxford Scales, PA-C 09/03/21 1413

## 2021-09-02 NOTE — Telephone Encounter (Signed)
Pt called in stating that she saw Carlis Abbott for an appt on yesterday for her lip nose. She is stating that her lip nose is bigger and it hurts  Pt does not know if she needs an antibiotic. Pt wants a call back

## 2021-09-02 NOTE — Telephone Encounter (Signed)
Looking at the [place] it looks like the lymph node is reactive from a previous infection according to [place] note.  This likely will take time to go down. If she is not having new symptoms of infection like fever, chills. She can continue symptomatic treatment.  If it is effecting her ability to swallow or breath or having difficulty managing her secretions then she would need to be evaluated in the emergency department.

## 2021-09-02 NOTE — Telephone Encounter (Signed)
Spoke with patient via phone, she was evaluated at Urgent Care and has antibiotics. Agree with plan.

## 2021-09-02 NOTE — Discharge Instructions (Addendum)
Today you were tested for COVID, influenza, respiratory syncytial virus, streptococcal pharyngitis and infectious mononucleosis.    There is a very small percentage of the adult population in the [place] that is diagnosed with infectious mononucleosis, approximately 2%.  The reason IM occurs in adults is due to a lack of exposure at a younger age, which is most common for the child and adolescent population, approximately 98%.  The treatment for COVID, flu, RSV and mono is supportive as I am sure you are aware.  Streptococcal pharyngitis is treated with antibiotics.  My physical exam findings today are concerning for acute infectious otitis media in your right ear.  Therefore, I recommend that you begin Augmentin to treat this.  I sent a 10-day course to your pharmacy, please take 1 tablet twice a day.  If your throat culture is positive for streptococcal pharyngitis, Augmentin will treat this.  If your lingering cough is due to a bacterial superinfection, 95% of cases of bronchitis are initially due to to viral infection but after 2 to 3 weeks they can evolve into a bacterial bronchitis. Augmentin will treat this as well.  While your blood pressure today is elevated, and that is a conversation you should have with your primary care provider, your oxygen level is stable and your heart rate is very good.  I do not believe that you are exhibiting any signs of respiratory distress or acute obstructive lung disease so I do not believe that you need any steroids at this time.  I took the liberty reading through the result of your neck ultrasound.  What they found is that your lymph nodes are reactive which simply means they are enlarged because you have been sick.  It is my hope that a 10-day course of a strong antibiotic will resolve your illness and your lymph nodes will return to normal size.  That being said, if this is not the magic bullet, I want you to go ahead and make an appointment with your primary  care provider today to be seen 7 to 10 days from now in the event that the antibiotic is not the answer.

## 2021-09-04 LAB — COVID-19, FLU A+B AND RSV
Influenza A, NAA: DETECTED — AB
Influenza B, NAA: NOT DETECTED
RSV, NAA: NOT DETECTED
SARS-CoV-2, NAA: NOT DETECTED

## 2021-09-05 LAB — CULTURE, GROUP A STREP (THRC)

## 2021-09-07 ENCOUNTER — Other Ambulatory Visit: Payer: Self-pay | Admitting: Family Medicine

## 2021-09-07 DIAGNOSIS — E119 Type 2 diabetes mellitus without complications: Secondary | ICD-10-CM

## 2021-09-08 ENCOUNTER — Other Ambulatory Visit: Payer: Self-pay

## 2021-09-08 ENCOUNTER — Encounter: Payer: Self-pay | Admitting: Family Medicine

## 2021-09-08 ENCOUNTER — Ambulatory Visit (INDEPENDENT_AMBULATORY_CARE_PROVIDER_SITE_OTHER): Payer: PPO | Admitting: Family Medicine

## 2021-09-08 VITALS — BP 144/80 | HR 94 | Temp 98.3°F | Ht 61.0 in | Wt 197.0 lb

## 2021-09-08 DIAGNOSIS — R9389 Abnormal findings on diagnostic imaging of other specified body structures: Secondary | ICD-10-CM | POA: Diagnosis not present

## 2021-09-08 DIAGNOSIS — R911 Solitary pulmonary nodule: Secondary | ICD-10-CM | POA: Diagnosis not present

## 2021-09-08 DIAGNOSIS — E119 Type 2 diabetes mellitus without complications: Secondary | ICD-10-CM | POA: Diagnosis not present

## 2021-09-08 LAB — COMPREHENSIVE METABOLIC PANEL
ALT: 54 U/L — ABNORMAL HIGH (ref 0–35)
AST: 47 U/L — ABNORMAL HIGH (ref 0–37)
Albumin: 4.1 g/dL (ref 3.5–5.2)
Alkaline Phosphatase: 40 U/L (ref 39–117)
BUN: 12 mg/dL (ref 6–23)
CO2: 33 mEq/L — ABNORMAL HIGH (ref 19–32)
Calcium: 9.8 mg/dL (ref 8.4–10.5)
Chloride: 100 mEq/L (ref 96–112)
Creatinine, Ser: 0.91 mg/dL (ref 0.40–1.20)
GFR: 64.45 mL/min (ref >60.00)
Glucose, Bld: 128 mg/dL — ABNORMAL HIGH (ref 70–99)
Potassium: 4.3 mEq/L (ref 3.5–5.1)
Sodium: 141 mEq/L (ref 135–145)
Total Bilirubin: 0.5 mg/dL (ref 0.2–1.2)
Total Protein: 6.9 g/dL (ref 6.0–8.3)

## 2021-09-08 LAB — LIPID PANEL
Cholesterol: 177 mg/dL (ref 0–200)
HDL: 38.2 mg/dL — ABNORMAL LOW (ref >39.00)
LDL Cholesterol: 99 mg/dL (ref 0–99)
NonHDL: 138.6
Total CHOL/HDL Ratio: 5
Triglycerides: 197 mg/dL — ABNORMAL HIGH (ref 0.0–149.0)
VLDL: 39.4 mg/dL (ref 0.0–40.0)

## 2021-09-08 LAB — HEMOGLOBIN A1C: Hgb A1c MFr Bld: 7.3 % — ABNORMAL HIGH (ref 4.6–6.5)

## 2021-09-08 NOTE — Patient Instructions (Signed)
Go to the lab on the way out.   If you have mychart we'll likely use that to update you.    We'll call about getting the CT set up.  Take care.  Glad to see you.

## 2021-09-08 NOTE — Progress Notes (Signed)
This visit occurred during the SARS-CoV-2 public health emergency.  Safety protocols were in place, including screening questions prior to the visit, additional usage of staff PPE, and extensive cleaning of exam room while observing appropriate contact time as indicated for disinfecting solutions.  Prev flu positive, also with reactive LA noted on u/s.  Prev treated with augmentin given sx duration and cough.  Prev CXR with questionable rounded nodular density in the medial left upper lung measuring 15 x 17 mm, possibly projecting posteriorly on the lateral view. Recommend further evaluation with chest CT, with IV contrast in the absence of contraindications.  D/w pt about CXR and imaging reviewed with patient.    She has sig improvement in the meantime.  LA is clearly better.  She feels better overall.  Sleeping better.  No FCNAVD.  Minimal cough now.  No sputum.  No hemoptysis.    Meds, vitals, and allergies reviewed.   ROS: Per HPI unless specifically indicated in ROS section   GEN: nad, alert and oriented HEENT: ncat, TM wnl.  NECK: supple w/minimal LA on R side CV: rrr.  PULM: ctab, no inc wob ABD: soft, +bs EXT: no edema SKIN: well perfused.

## 2021-09-12 DIAGNOSIS — R9389 Abnormal findings on diagnostic imaging of other specified body structures: Secondary | ICD-10-CM | POA: Insufficient documentation

## 2021-09-12 NOTE — Assessment & Plan Note (Signed)
30 minutes were devoted to patient care in this encounter (this includes time spent reviewing the patient's file/history, interviewing and examining the patient, counseling/reviewing plan with patient).   Previous imaging reviewed with patient at office visit.  She is clearly improved in the meantime which is good but she needs work-up for the incidental abnormality seen on x-ray.  Her lymphadenopathy is improved and she is feeling better otherwise that she is still okay for outpatient follow-up.  Lungs are clear.  She agrees with plan.  We talked about the rationale for the CT and we will check creatinine today.  See notes on labs.

## 2021-09-13 ENCOUNTER — Telehealth: Payer: Self-pay

## 2021-09-13 ENCOUNTER — Other Ambulatory Visit: Payer: Self-pay

## 2021-09-13 ENCOUNTER — Telehealth: Payer: Self-pay | Admitting: Family Medicine

## 2021-09-13 ENCOUNTER — Ambulatory Visit
Admission: RE | Admit: 2021-09-13 | Discharge: 2021-09-13 | Disposition: A | Discharge status: Home / Self Care | Payer: PPO | Source: Ambulatory Visit | Attending: Family Medicine | Admitting: Family Medicine

## 2021-09-13 ENCOUNTER — Other Ambulatory Visit: Payer: Self-pay | Admitting: Family Medicine

## 2021-09-13 ENCOUNTER — Ambulatory Visit: Payer: PPO | Admitting: Family Medicine

## 2021-09-13 DIAGNOSIS — R911 Solitary pulmonary nodule: Secondary | ICD-10-CM | POA: Diagnosis not present

## 2021-09-13 DIAGNOSIS — R222 Localized swelling, mass and lump, trunk: Secondary | ICD-10-CM

## 2021-09-13 DIAGNOSIS — R9389 Abnormal findings on diagnostic imaging of other specified body structures: Secondary | ICD-10-CM | POA: Insufficient documentation

## 2021-09-13 DIAGNOSIS — I700 Atherosclerosis of aorta: Secondary | ICD-10-CM | POA: Diagnosis not present

## 2021-09-13 MED ORDER — IOHEXOL 300 MG/ML  SOLN
75.0000 mL | Freq: Once | INTRAMUSCULAR | Status: AC | PRN
  Administered 2021-09-13: 75 mL via INTRAVENOUS

## 2021-09-13 NOTE — Telephone Encounter (Signed)
Rubi with [facility] called reporting that there was findings on pt CP test.

## 2021-09-13 NOTE — Telephone Encounter (Signed)
Radiology called the office with call report. I tried having call sent to me to take but the front staff hung up with radiology before I could get the report. The results are in epic for review.

## 2021-09-13 NOTE — Telephone Encounter (Signed)
See other TE note. This has already been called about today and Dr. Damita Dunnings has reviewed report.

## 2021-09-13 NOTE — Telephone Encounter (Signed)
Will address.  Thanks.

## 2021-09-15 ENCOUNTER — Other Ambulatory Visit: Payer: PPO

## 2021-09-16 ENCOUNTER — Telehealth (INDEPENDENT_AMBULATORY_CARE_PROVIDER_SITE_OTHER): Payer: PPO | Admitting: Family Medicine

## 2021-09-16 ENCOUNTER — Other Ambulatory Visit: Payer: Self-pay

## 2021-09-16 VITALS — Ht 61.0 in | Wt 195.0 lb

## 2021-09-16 DIAGNOSIS — R222 Localized swelling, mass and lump, trunk: Secondary | ICD-10-CM | POA: Diagnosis not present

## 2021-09-16 DIAGNOSIS — I2510 Atherosclerotic heart disease of native coronary artery without angina pectoris: Secondary | ICD-10-CM

## 2021-09-16 MED ORDER — ROSUVASTATIN CALCIUM 5 MG PO TABS: 5.0000 mg | ORAL_TABLET | Freq: Every day | ORAL | 3 refills | Status: DC

## 2021-09-16 NOTE — Progress Notes (Signed)
Interactive audio and video telecommunications were attempted between this provider and patient, however failed, due to patient having technical difficulties OR patient did not have access to video capability.  We continued and completed visit with audio only.   Virtual Visit via Telephone Note  I connected with patient on 09/16/21  at 4:28 PM  by telephone and verified that I am speaking with the correct person using two identifiers.  Location of patient: home   Location of MD: [place] Name of referring provider (if blank then none associated): Names per persons and role in encounter:  MD: Earlyne Iba, Patient: name listed above.    I discussed the limitations, risks, security and privacy concerns of performing an evaluation and management service by telephone and the availability of in person appointments. I also discussed with the patient that there may be a patient responsible charge related to this service. The patient expressed understanding and agreed to proceed.  CC: f/u re: CT.    History of Present Illness:   Discussed with patient about thoracic lesion noted on CT.  Differential diagnosis discussed with patient, including schwannoma or similar.  She does not have lymphadenopathy.  No significant lesion in the long.  Discussed with patient about getting MRI then likely neurosurgery referral if needed.    D/w pt-she has no sx. no thoracic pain or radicular pain.  Mammogram done 08/10/21.  I will ask Dr. Donne Hazel about input.   I see that she had her mammogram done but I had difficulty pulling up the report at the time of the visit.  Discussed.  CAD. Prev statin intolerance.  No CP with walking 2 miles at a time.  D/w pt about trying crestor.  Discussed with patient about getting better characterization of the calcium deposits with coronary calcium CT scoring.  Discussed starting crestor 5mg  per day with routine cautions d/w pt.    Observations/Objective: Nad Speech  normal  Assessment and Plan:  Thoracic mass.  We will get MRI and go from there.  I will update Dr. Donne Hazel and request a repeat report regarding her mammogram.  See above.  Differential diagnosis discussed with patient.  It is still possible for her to have a benign nerve sheath tumor.  Incidental coronary artery disease.  Start Crestor.  We will arrange for calcium scoring as previous CT did not delineate her amount of coronary calcification.  Follow Up Instructions: see above.     I discussed the assessment and treatment plan with the patient. The patient was provided an opportunity to ask questions and all were answered. The patient agreed with the plan and demonstrated an understanding of the instructions.   The patient was advised to call back or seek an in-person evaluation if the symptoms worsen or if the condition fails to improve as anticipated.  I provided 30 minutes of non-face-to-face time during this encounter.  [name], MD

## 2021-09-18 DIAGNOSIS — R222 Localized swelling, mass and lump, trunk: Secondary | ICD-10-CM | POA: Insufficient documentation

## 2021-09-18 DIAGNOSIS — I2510 Atherosclerotic heart disease of native coronary artery without angina pectoris: Secondary | ICD-10-CM | POA: Insufficient documentation

## 2021-09-18 NOTE — Assessment & Plan Note (Signed)
  Incidental coronary artery disease.  Start Crestor.  We will arrange for calcium scoring as previous CT did not delineate her amount of coronary calcification.

## 2021-09-18 NOTE — Assessment & Plan Note (Signed)
Thoracic mass.  We will get MRI and go from there.  I will update Dr. Donne Hazel and request a repeat report regarding her mammogram.  See above.  Differential diagnosis discussed with patient.  It is still possible for her to have a benign nerve sheath tumor.

## 2021-09-22 ENCOUNTER — Ambulatory Visit (INDEPENDENT_AMBULATORY_CARE_PROVIDER_SITE_OTHER): Payer: PPO | Admitting: Family Medicine

## 2021-09-22 ENCOUNTER — Other Ambulatory Visit: Payer: Self-pay

## 2021-09-22 ENCOUNTER — Encounter: Payer: Self-pay | Admitting: Family Medicine

## 2021-09-22 VITALS — BP 160/80 | HR 83 | Temp 98.0°F | Ht 61.0 in | Wt 198.0 lb

## 2021-09-22 DIAGNOSIS — E119 Type 2 diabetes mellitus without complications: Secondary | ICD-10-CM

## 2021-09-22 DIAGNOSIS — R222 Localized swelling, mass and lump, trunk: Secondary | ICD-10-CM

## 2021-09-22 DIAGNOSIS — I2510 Atherosclerotic heart disease of native coronary artery without angina pectoris: Secondary | ICD-10-CM

## 2021-09-22 DIAGNOSIS — Z780 Asymptomatic menopausal state: Secondary | ICD-10-CM

## 2021-09-22 DIAGNOSIS — Z0000 Encounter for general adult medical examination without abnormal findings: Secondary | ICD-10-CM | POA: Diagnosis not present

## 2021-09-22 DIAGNOSIS — Z7189 Other specified counseling: Secondary | ICD-10-CM

## 2021-09-22 MED ORDER — OLMESARTAN MEDOXOMIL-HCTZ 40-25 MG PO TABS: ORAL_TABLET | ORAL | 3 refills | Status: DC

## 2021-09-22 MED ORDER — METFORMIN HCL 500 MG PO TABS: 1000.0000 mg | ORAL_TABLET | Freq: Two times a day (BID) | ORAL | 3 refills | Status: DC

## 2021-09-22 MED ORDER — GLUCOSE BLOOD VI STRP: ORAL_STRIP | 12 refills | Status: DC

## 2021-09-22 MED ORDER — FLUTICASONE PROPIONATE 50 MCG/ACT NA SUSP: 1.0000 | Freq: Every day | NASAL | 3 refills | Status: DC

## 2021-09-22 MED ORDER — ESOMEPRAZOLE MAGNESIUM 40 MG PO CPDR: DELAYED_RELEASE_CAPSULE | ORAL | 3 refills | Status: DC

## 2021-09-22 NOTE — Progress Notes (Addendum)
This visit occurred during the SARS-CoV-2 public health emergency.  Safety protocols were in place, including screening questions prior to the visit, additional usage of staff PPE, and extensive cleaning of exam room while observing appropriate contact time as indicated for disinfecting solutions.  I have personally reviewed the Medicare Annual Wellness questionnaire and have noted 1. The patient's medical and social history 2. Their use of alcohol, tobacco or illicit drugs 3. Their current medications and supplements 4. The patient's functional ability including ADL's, fall risks, home safety risks and hearing or visual             impairment. 5. Diet and physical activities 6. Evidence for depression or mood disorders  The patients weight, height, BMI have been recorded in the chart and visual acuity is per eye clinic.  I have made referrals, counseling and provided education to the patient based review of the above and I have provided the pt with a written personalized care plan for preventive services.  Provider list updated- see scanned forms.  Routine anticipatory guidance given to patient.  See health maintenance. The possibility exists that previously documented standard health maintenance information may have been brought forward from a previous encounter into this note.  If needed, that same information has been updated to reflect the current situation based on today's encounter.    Flu 2022 Shingles previously done PNA previously done Tetanus 2014 COVID-vaccine previously done Colonoscopy scheduled 2022 Breast cancer screening 2022 Bone density test 2018, ordered 2022. Advance directive-husband designated if patient were incapacitated. Cognitive function addressed- see scanned forms- and if abnormal then additional documentation follows.   In addition to [facility], follow up visit for the below conditions:  D/w pt about thoracic mass w/u- I had contact with Dr.  Donne Hazel and he agreed with the plan for MRI.  D/w pt.  Stressors d/w pt- that may have affected he BP today- she can monitor.    Cardiac CT pending for 10/13/21.  No exertional CP.  Started crestor.  No ADE on med.  Prev labs d/w pt.  Statin cautions d/w pt.    PMH and SH reviewed  Meds, vitals, and allergies reviewed.   ROS: Per HPI.  Unless specifically indicated otherwise in HPI, the patient denies:  General: fever. Eyes: acute vision changes ENT: sore throat Cardiovascular: chest pain Respiratory: SOB GI: vomiting GU: dysuria Musculoskeletal: acute back pain Derm: acute rash Neuro: acute motor dysfunction Psych: worsening mood Endocrine: polydipsia Heme: bleeding Allergy: hayfever  GEN: nad, alert and oriented HEENT: ncat NECK: supple w/o LA CV: rrr. PULM: ctab, no inc wob ABD: soft, +bs EXT: no edema SKIN: no acute rash

## 2021-09-22 NOTE — Patient Instructions (Addendum)
I would get through the next two scans and then go from there.   Recheck A1c and CMET/lipids prior to a visit in about 3 months.   Update me if you have aches on crestor.  Take care.  Glad to see you.

## 2021-09-24 ENCOUNTER — Ambulatory Visit: Payer: PPO

## 2021-09-25 NOTE — Assessment & Plan Note (Signed)
Cardiac CT pending for 10/13/21.  No exertional CP.  Started crestor.  No ADE on med.  Prev labs d/w pt.  Statin cautions d/w pt. we can recheck her labs later on.  She agrees with plan.

## 2021-09-25 NOTE — Assessment & Plan Note (Signed)
D/w pt about thoracic mass w/u- I had contact with Dr. Donne Hazel and he agreed with the plan for MRI.  D/w pt.  Stressors d/w pt- that may have affected he BP today- she can monitor her BP at home and update me as needed.  I will await her MRI.

## 2021-09-25 NOTE — Assessment & Plan Note (Signed)
Advance directive- husband designated if patient were incapacitated.  

## 2021-09-25 NOTE — Assessment & Plan Note (Addendum)
  Flu 2022 Shingles previously done PNA previously done Tetanus 2014 COVID-vaccine previously done Colonoscopy scheduled 2022 Breast cancer screening 2022 Bone density test 2018, ordered 2022. Advance directive-husband designated if patient were incapacitated. Cognitive function addressed- see scanned forms- and if abnormal then additional documentation follows.

## 2021-09-26 ENCOUNTER — Other Ambulatory Visit: Payer: Self-pay | Admitting: Family Medicine

## 2021-09-26 ENCOUNTER — Ambulatory Visit
Admission: RE | Admit: 2021-09-26 | Discharge: 2021-09-26 | Disposition: A | Discharge status: Home / Self Care | Payer: PPO | Source: Ambulatory Visit | Attending: Family Medicine | Admitting: Family Medicine

## 2021-09-26 DIAGNOSIS — R222 Localized swelling, mass and lump, trunk: Secondary | ICD-10-CM | POA: Diagnosis not present

## 2021-09-26 DIAGNOSIS — M40204 Unspecified kyphosis, thoracic region: Secondary | ICD-10-CM | POA: Diagnosis not present

## 2021-09-26 DIAGNOSIS — Z853 Personal history of malignant neoplasm of breast: Secondary | ICD-10-CM | POA: Diagnosis not present

## 2021-09-26 DIAGNOSIS — D492 Neoplasm of unspecified behavior of bone, soft tissue, and skin: Secondary | ICD-10-CM

## 2021-09-26 MED ORDER — GADOBUTROL 1 MMOL/ML IV SOLN
8.0000 mL | Freq: Once | INTRAVENOUS | Status: AC | PRN
  Administered 2021-09-26: 8 mL via INTRAVENOUS

## 2021-09-29 ENCOUNTER — Telehealth: Payer: PPO | Admitting: Family Medicine

## 2021-10-11 ENCOUNTER — Encounter: Payer: Self-pay | Admitting: *Deleted

## 2021-10-13 ENCOUNTER — Other Ambulatory Visit: Payer: Self-pay

## 2021-10-13 ENCOUNTER — Ambulatory Visit (INDEPENDENT_AMBULATORY_CARE_PROVIDER_SITE_OTHER)
Admission: RE | Admit: 2021-10-13 | Discharge: 2021-10-13 | Disposition: A | Discharge status: Home / Self Care | Payer: Self-pay | Source: Ambulatory Visit | Attending: Family Medicine | Admitting: Family Medicine

## 2021-10-13 ENCOUNTER — Encounter (HOSPITAL_COMMUNITY): Payer: Self-pay | Admitting: Radiology

## 2021-10-13 DIAGNOSIS — I2510 Atherosclerotic heart disease of native coronary artery without angina pectoris: Secondary | ICD-10-CM

## 2021-10-14 NOTE — Telephone Encounter (Signed)
Noted  Referral updated.   Nothing further needed.

## 2021-10-18 DIAGNOSIS — D3614 Benign neoplasm of peripheral nerves and autonomic nervous system of thorax: Secondary | ICD-10-CM | POA: Diagnosis not present

## 2021-10-18 DIAGNOSIS — I10 Essential (primary) hypertension: Secondary | ICD-10-CM | POA: Diagnosis not present

## 2021-10-18 DIAGNOSIS — Z6837 Body mass index (BMI) 37.0-37.9, adult: Secondary | ICD-10-CM | POA: Diagnosis not present

## 2021-10-20 ENCOUNTER — Other Ambulatory Visit: Payer: Self-pay

## 2021-10-20 ENCOUNTER — Telehealth: Payer: PPO | Admitting: Family Medicine

## 2021-10-24 ENCOUNTER — Telehealth: Payer: PPO | Admitting: Family Medicine

## 2021-10-28 ENCOUNTER — Telehealth: Payer: PPO | Admitting: Family Medicine

## 2021-10-31 ENCOUNTER — Encounter: Payer: Self-pay | Admitting: Family Medicine

## 2021-10-31 ENCOUNTER — Telehealth (INDEPENDENT_AMBULATORY_CARE_PROVIDER_SITE_OTHER): Payer: PPO | Admitting: Family Medicine

## 2021-10-31 ENCOUNTER — Other Ambulatory Visit: Payer: Self-pay

## 2021-10-31 DIAGNOSIS — I2510 Atherosclerotic heart disease of native coronary artery without angina pectoris: Secondary | ICD-10-CM | POA: Diagnosis not present

## 2021-10-31 DIAGNOSIS — R222 Localized swelling, mass and lump, trunk: Secondary | ICD-10-CM

## 2021-10-31 NOTE — Progress Notes (Signed)
Virtual visit completed through WebEx or similar program Patient location: home  Provider location: [place] at [facility], office  Participants: Patient and me (unless stated otherwise below)  Pandemic considerations d/w pt.   Limitations and rationale for visit method d/w patient.  Patient agreed to proceed.   CC: calcium scoring.    HPI:  She had neurosurgery f/u in the meantime and the plan was for observation.  She has f/u pending with neurosurgery in 2023.  I'll defer and she agrees.    CAD.  No CP, not SOB.  She is tolerating crestor 5mg  daily.  No ADE on med.  She had aches on pravastatin that improved with med cessation.  She has been on crestor about 1 month.    Meds and allergies reviewed.   ROS: Per HPI unless specifically indicated in ROS section   NAD Speech wnl  A/P: CAD.  D/w pt about options and prev scoring.  No CP.  Would continue rosuvastatin as is. We can check labs in few months, as scheduled in 12/2021.  We can consider lipid clinic referral if needed.    Nerve sheath tumor per neurosurgery.  I will defer.  She is asymptomatic.

## 2021-11-02 NOTE — Assessment & Plan Note (Signed)
°  Nerve sheath tumor per neurosurgery.  I will defer.  She is asymptomatic.

## 2021-11-02 NOTE — Assessment & Plan Note (Signed)
CAD.  D/w pt about options and prev scoring.  No CP.  Would continue rosuvastatin as is. We can check labs in few months, as scheduled in 12/2021.  We can consider lipid clinic referral if needed.

## 2021-11-25 DIAGNOSIS — M7062 Trochanteric bursitis, left hip: Secondary | ICD-10-CM | POA: Diagnosis not present

## 2021-11-25 DIAGNOSIS — M7061 Trochanteric bursitis, right hip: Secondary | ICD-10-CM | POA: Diagnosis not present

## 2021-11-30 DIAGNOSIS — M65331 Trigger finger, right middle finger: Secondary | ICD-10-CM | POA: Diagnosis not present

## 2021-12-07 ENCOUNTER — Encounter: Payer: Self-pay | Admitting: Family Medicine

## 2021-12-16 ENCOUNTER — Encounter: Payer: Self-pay | Admitting: Family Medicine

## 2021-12-20 DIAGNOSIS — E119 Type 2 diabetes mellitus without complications: Secondary | ICD-10-CM | POA: Diagnosis not present

## 2021-12-20 DIAGNOSIS — H25813 Combined forms of age-related cataract, bilateral: Secondary | ICD-10-CM | POA: Diagnosis not present

## 2021-12-20 DIAGNOSIS — H35033 Hypertensive retinopathy, bilateral: Secondary | ICD-10-CM | POA: Diagnosis not present

## 2021-12-20 LAB — HM DIABETES EYE EXAM

## 2021-12-21 DIAGNOSIS — M25552 Pain in left hip: Secondary | ICD-10-CM | POA: Diagnosis not present

## 2022-01-02 ENCOUNTER — Other Ambulatory Visit (INDEPENDENT_AMBULATORY_CARE_PROVIDER_SITE_OTHER): Payer: PPO

## 2022-01-02 ENCOUNTER — Other Ambulatory Visit: Payer: Self-pay

## 2022-01-02 DIAGNOSIS — E119 Type 2 diabetes mellitus without complications: Secondary | ICD-10-CM

## 2022-01-02 DIAGNOSIS — M25552 Pain in left hip: Secondary | ICD-10-CM | POA: Diagnosis not present

## 2022-01-02 LAB — COMPREHENSIVE METABOLIC PANEL
ALT: 33 U/L (ref 0–35)
AST: 36 U/L (ref 0–37)
Albumin: 4.1 g/dL (ref 3.5–5.2)
Alkaline Phosphatase: 40 U/L (ref 39–117)
BUN: 9 mg/dL (ref 6–23)
CO2: 37 mEq/L — ABNORMAL HIGH (ref 19–32)
Calcium: 9.8 mg/dL (ref 8.4–10.5)
Chloride: 100 mEq/L (ref 96–112)
Creatinine, Ser: 0.87 mg/dL (ref 0.40–1.20)
GFR: 67.87 mL/min (ref >60.00)
Glucose, Bld: 147 mg/dL — ABNORMAL HIGH (ref 70–99)
Potassium: 3.9 mEq/L (ref 3.5–5.1)
Sodium: 139 mEq/L (ref 135–145)
Total Bilirubin: 0.4 mg/dL (ref 0.2–1.2)
Total Protein: 7.2 g/dL (ref 6.0–8.3)

## 2022-01-02 LAB — HEMOGLOBIN A1C: Hgb A1c MFr Bld: 7.6 % — ABNORMAL HIGH (ref 4.6–6.5)

## 2022-01-02 LAB — LIPID PANEL
Cholesterol: 145 mg/dL (ref 0–200)
HDL: 59.7 mg/dL (ref >39.00)
LDL Cholesterol: 58 mg/dL (ref 0–99)
NonHDL: 85.06
Total CHOL/HDL Ratio: 2
Triglycerides: 136 mg/dL (ref 0.0–149.0)
VLDL: 27.2 mg/dL (ref 0.0–40.0)

## 2022-01-05 DIAGNOSIS — M25552 Pain in left hip: Secondary | ICD-10-CM | POA: Diagnosis not present

## 2022-01-09 ENCOUNTER — Ambulatory Visit: Payer: PPO | Admitting: Family Medicine

## 2022-01-10 DIAGNOSIS — M25552 Pain in left hip: Secondary | ICD-10-CM | POA: Diagnosis not present

## 2022-01-11 DIAGNOSIS — Z01419 Encounter for gynecological examination (general) (routine) without abnormal findings: Secondary | ICD-10-CM | POA: Diagnosis not present

## 2022-01-11 DIAGNOSIS — Z6837 Body mass index (BMI) 37.0-37.9, adult: Secondary | ICD-10-CM | POA: Diagnosis not present

## 2022-01-13 DIAGNOSIS — M25552 Pain in left hip: Secondary | ICD-10-CM | POA: Diagnosis not present

## 2022-01-16 ENCOUNTER — Telehealth: Payer: Self-pay

## 2022-01-16 NOTE — Progress Notes (Signed)
? ? ?Chronic Care Management ?Pharmacy Assistant  ? ?Name: Anita Cooper  MRN: [id number] DOB: Jan 23, 1951 ? ?Reason for Encounter: CCM (Diabetes Disease State) ?  ?Recent office visits:  ?12/07/2021 - [name], MD - Message - Blood sugar above 100 take 1 Metformin twice a day; below 100 stop taking totally due to blood sugar dropping to 59.  ?10/31/2021 - [name], MD - Video Visit - Patient presented for mass of thoracic structure. Diagnosis: Nerve sheath tumor per neurosurgery. ?10/13/2021 - [name], MD - Referral to Neurosurgery  ?11/22/2020 - [name], MD - Patient presented for Annual Wellness Visit. Abnormal Labs: "Will discuss at OV." Change: metFORMIN (GLUCOPHAGE) 500 MG tablet 2 times daily with meals vs. Take up to two tables twice daily.  ?09/16/2021 - [name], MD - Video Visit - Patient presented for coronary artery disease - Ordered: CT Cardiac Scoring. Start: due to coronary artery disease rosuvastatin (CRESTOR) 5 MG tablet. Stop due to completed course: amoxicillin-clavulanate (AUGMENTIN) 875-125 MG tablet. Found: Thoracis mass: repeat mammogram.  ?09/13/2021 - [name], MD - Orders Only - MR Thoracic spine due to findings on CP test. ?09/08/2021 - [name], MD - Patient presented for solitary pulmonary nodule. Ordered: CT Chest Abnormal labs: "Your labs are reasonable.  Your kidney function is normal. Your A1c is a little higher than before but not as high as some previous values." Stop as patient not taking: benzonatate (TESSALON) 100 MG capsule, ipratropium (ATROVENT) 0.06 % nasal spray and promethazine-phenylephrine (PROMETHAZINE VC) 6.25-5 MG/5ML SYRP.  ? ?Recent consult visits:  ?11/30/2021 - [facility] - Patient presented for trigger finger of right middle finger. No other information.  ?11/25/2021 - [place] - Orthopaedic Surgery - Patient presented for trochanteric bursitis of right hip. No other information.  ?10/18/2021 [facility] Neurosurgery - Patient presented for hypertension. No other information.  ?09/26/2021 - Radiology - MR Thoracic spine  ?09/13/2021 - Radiology - CT Chest ?09/02/2021 - Radiology - DG X-ray ?09/02/2021 - [facility] - Patient presented for fever and neck pain. Start: amoxicillin-clavulanate (AUGMENTIN) 875-125 MG tablet.  ?09/01/2021 - Alma Friendly, NP - Patient presented for facial swelling. Ordered and completed: US Soft tissue head and neck. Stop all due to completed courses: fluconazole (DIFLUCAN) 150 MG tablet, nystatin (MYCOSTATIN/NYSTOP) powder and predniSONE (DELTASONE) 20 MG tablet. Administered: ketorolac (TORADOL) injection 60 mg ?08/30/2021 - [facility] - Patient presented for viral URI with cough. Start: albuterol (VENTOLIN HFA) 108 (90 Base) MCG/ACT inhaler. Start: benzonatate (TESSALON) 100 MG capsule. Start: ipratropium (ATROVENT) 0.06 % nasal spray Start: promethazine-phenylephrine (PROMETHAZINE VC) 6.25-5 MG/5ML SYRP. ?08/10/2021 - [facility] - Hand Surgery - Patient presented for pain in right fingers. No other information.  ?08/10/2021 - Radiology - Mammogram ? ?Hospital visits:  ?None in previous 6 months ? ?Medications: ?Outpatient Encounter Medications as of 01/16/2022  ?Medication Sig  ? aspirin 81 MG tablet Take 81 mg by mouth daily.  ? esomeprazole (NEXIUM) 40 MG capsule TAKE 1 CAPSULE BY MOUTH EVERY DAY BEFORE BREAKFAST  ? fexofenadine (ALLEGRA) 180 MG tablet Take 180 mg by mouth daily.  ? fluticasone (FLONASE) 50 MCG/ACT nasal spray Place 1-2 sprays into both nostrils daily.  ? glucose blood test strip Use as instructed to check sugar daily.  Dx E11.9.  one touch strips.  ? ibuprofen (ADVIL) 200 MG tablet Take 400 mg by mouth every 6 (six) hours as needed.  ? metFORMIN (GLUCOPHAGE) 500  MG tablet Take 2 tablets (1,000 mg total) by mouth 2 (two) times daily with a meal.  ? Multiple Vitamin (MULTIVITAMIN) tablet Take 1 tablet by mouth daily.  ?  olmesartan-hydrochlorothiazide (BENICAR HCT) 40-25 MG tablet TAKE 1/2 TABLETS BY MOUTH DAILY  ? rosuvastatin (CRESTOR) 5 MG tablet Take 1 tablet (5 mg total) by mouth daily.  ? triamcinolone cream (KENALOG) 0.1 % Apply 1 application topically 2 (two) times daily.  ? ?No facility-administered encounter medications on file as of 01/16/2022.  ? ?Recent Relevant Labs: ?Lab Results  ?Component Value Date/Time  ? HGBA1C 7.6 (H) 01/02/2022 08:57 AM  ? HGBA1C 7.3 (H) 09/08/2021 09:16 AM  ?  ?Kidney Function ?Lab Results  ?Component Value Date/Time  ? CREATININE 0.87 01/02/2022 08:57 AM  ? CREATININE 0.91 09/08/2021 09:16 AM  ? GFR 67.87 01/02/2022 08:57 AM  ? GFRNONAA 72.94 08/19/2010 11:06 AM  ? GFRAA  12/14/2009 03:45 PM  ?  >60        ?The eGFR has been calculated ?using the MDRD equation. ?This calculation has not been ?validated in all clinical ?situations. ?eGFR's persistently ?<60 mL/min signify ?possible Chronic Kidney Disease.  ? ?Attempted contact with patient 3 times on 03/06, 03/07, 03/08.Marland Kitchen Unsuccessful outreach. Will atttempt contact next month. ? ?Current antihyperglycemic regimen:  ?Metformin 500 mg Blood sugar above 100 take 1 Metformin twice a day; below 100 stop taking totally - Per Dr. Damita Dunnings on 12/07/2021 ?  What diet changes have been made to improve diabetes control? ? ?What recent interventions/DTPs have been made to improve glycemic control:  ?Blood sugar above 100 take 1 Metformin twice a day; below 100 stop taking totally ? ?Adherence Review: ?Is the patient currently on a STATIN medication? Yes ?Is the patient currently on ACE/ARB medication? Yes ?Does the patient have >5 day gap between last estimated fill dates? No ? ?Care Gaps: ?Annual wellness visit in last year? Yes 09/22/2021 ?Most recent A1C reading: 7.6 on 01/02/2022 ?Most Recent BP reading: 160/80 on 09/22/2021 ? ?Last eye exam / retinopathy screening: Up to date ?Last diabetic foot exam: Up to date ? ?Star Rating Drugs:   ?Medication:    Last Fill: Day Supply ?Olmesartan-HCTZ 40-25 MG  11/04/2021 90   ?Metformin 500 mg   11/28/2021 90    ?Rosuvastatin 5 mg   12/10/2021 90    ? ?PCP appointment on 03/10/20223 ?CCM appointment on 02/15/2022 ? ?Charlene Brooke, CPP notified ? ?Marijean Niemann, RMA ?Clinical Pharmacy Assistant ?[phone] ? ? ?

## 2022-01-18 DIAGNOSIS — M25552 Pain in left hip: Secondary | ICD-10-CM | POA: Diagnosis not present

## 2022-01-20 ENCOUNTER — Other Ambulatory Visit: Payer: Self-pay

## 2022-01-20 ENCOUNTER — Ambulatory Visit (INDEPENDENT_AMBULATORY_CARE_PROVIDER_SITE_OTHER): Payer: PPO | Admitting: Family Medicine

## 2022-01-20 ENCOUNTER — Encounter: Payer: Self-pay | Admitting: Family Medicine

## 2022-01-20 DIAGNOSIS — I10 Essential (primary) hypertension: Secondary | ICD-10-CM

## 2022-01-20 DIAGNOSIS — E119 Type 2 diabetes mellitus without complications: Secondary | ICD-10-CM | POA: Diagnosis not present

## 2022-01-20 MED ORDER — METFORMIN HCL 500 MG PO TABS: 500.0000 mg | ORAL_TABLET | Freq: Two times a day (BID) | ORAL | Status: DC

## 2022-01-20 MED ORDER — ONETOUCH VERIO W/DEVICE KIT: PACK | 0 refills | Status: DC

## 2022-01-20 MED ORDER — OLMESARTAN MEDOXOMIL-HCTZ 40-25 MG PO TABS: ORAL_TABLET | ORAL | 3 refills | Status: DC

## 2022-01-20 NOTE — Patient Instructions (Addendum)
I would continue 1 metformin twice a day for now . ?I would only increase the AM dose of 2 tabs if you have significantly higher sugars (200s) during the day.  ?I wouldn't add on an extra PM dose unless you had AM sugars above 200.  Please call me if that is going on.   ? ?We can recheck at a yearly visit in the fall.  Labs ahead of time if possible.  If sugar is elevated in the meantime, then let me know.  We can recheck sooner.  ? ?If you get lightheaded then cut the BP medicine back in 1/2 and update [place].   ?Take care.  Glad to see you. ?

## 2022-01-20 NOTE — Progress Notes (Signed)
This visit occurred during the SARS-CoV-2 public health emergency.  Safety protocols were in place, including screening questions prior to the visit, additional usage of staff PPE, and extensive cleaning of exam room while observing appropriate contact time as indicated for disinfecting solutions. ? ?Diabetes:  ?Using medications without difficulties: she had cut back to 1 metformin BID.  Sugar has been ~100-140s after that.   ?Hypoglycemic episodes: not on current dose of metformin.   ?Hyperglycemic episodes: not on current dose of metformin.  ?Feet problems: no ?Blood Sugars averaging: see above.   ?eye exam within last year: yes ?Recent labs d/w pt.   ?She isn't having high sugars during the day.   ?Noted that she had steroid injections and that likely affected her A1c.  Her LFTs are better, as is her lipid panel.   ? ?HTN.  BP was higher and she inc'd olmesartan HCTZ.  Blood pressure now controlled. ? ?She needed new rx for glucometer.  Rx sent.   ? ?She has neurosurgery f/u pending.   ? ?Meds, vitals, and allergies reviewed.  ? ?ROS: Per HPI unless specifically indicated in ROS section  ? ?GEN: nad, alert and oriented ?HEENT: ncat ?NECK: supple w/o LA ?CV: rrr. ?PULM: ctab, no inc wob ?ABD: soft, +bs ?EXT: no edema ?SKIN: well perfused.   ?

## 2022-01-22 NOTE — Assessment & Plan Note (Signed)
Recent labs d/w pt.   ?She isn't having high sugars during the day.   ?Noted that she had steroid injections and that likely affected her A1c.  Her LFTs are better, as is her lipid panel.   ? ?I would continue 1 metformin twice a day for now . ?I would only increase the AM dose of 2 tabs if significantly higher sugars (200s) during the day.  ?I wouldn't add on an extra PM dose unless AM sugars above 200.  See after visit summary. ? ?

## 2022-01-22 NOTE — Assessment & Plan Note (Signed)
If lightheaded then cut olmesartan hydrochlorothiazide back in 1/2 and update the clinic.  She agrees. ?

## 2022-01-23 ENCOUNTER — Telehealth: Payer: Self-pay | Admitting: Family Medicine

## 2022-01-23 ENCOUNTER — Encounter: Payer: Self-pay | Admitting: Family Medicine

## 2022-01-23 NOTE — Telephone Encounter (Signed)
Called and discussed with patient about device. Patient stated this is the one she uses; after looking epic shows this device is not covered by insurance. She had already picked up Verio device that was sent in last week but took it back. She is going to see if her insurance will let her get this again and will call back if she can.  ?

## 2022-01-23 NOTE — Telephone Encounter (Signed)
?  Encourage patient to contact the pharmacy for refills or they can request refills through [facility] ? ?LAST APPOINTMENT DATE:  Please schedule appointment if longer than 1 year ? ?NEXT APPOINTMENT DATE: ? ?MEDICATION: One touch Ultra 2 ? ?Is the patient out of medication?  ? ?PHARMACY:[id number] [address]? ?Let patient know to contact pharmacy at the end of the day to make sure medication is ready. ? ?Please notify patient to allow 48-72 hours to process ? ?CLINICAL FILLS OUT ALL BELOW:  ? ?LAST REFILL: ? ?QTY: ? ?REFILL DATE: ? ? ? ?OTHER COMMENTS:  ? ? ?Okay for refill? ? ?Please advise ? ? ?  ?

## 2022-01-24 DIAGNOSIS — D120 Benign neoplasm of cecum: Secondary | ICD-10-CM | POA: Diagnosis not present

## 2022-01-24 DIAGNOSIS — D123 Benign neoplasm of transverse colon: Secondary | ICD-10-CM | POA: Diagnosis not present

## 2022-01-24 DIAGNOSIS — K5730 Diverticulosis of large intestine without perforation or abscess without bleeding: Secondary | ICD-10-CM | POA: Diagnosis not present

## 2022-01-24 DIAGNOSIS — K219 Gastro-esophageal reflux disease without esophagitis: Secondary | ICD-10-CM | POA: Diagnosis not present

## 2022-01-24 DIAGNOSIS — Z1211 Encounter for screening for malignant neoplasm of colon: Secondary | ICD-10-CM | POA: Diagnosis not present

## 2022-01-24 DIAGNOSIS — K635 Polyp of colon: Secondary | ICD-10-CM | POA: Diagnosis not present

## 2022-01-24 LAB — HM COLONOSCOPY

## 2022-01-24 MED ORDER — ONETOUCH ULTRA 2 W/DEVICE KIT
PACK | 0 refills | Status: AC
Start: 2022-01-24 — End: ?

## 2022-01-24 NOTE — Addendum Note (Signed)
Addended by: Sherrilee Gilles B on: 01/24/2022 08:26 AM ? ? Modules accepted: Orders ? ?

## 2022-01-24 NOTE — Telephone Encounter (Signed)
Patient called back and stated that the one touch ultra 2 is covered and wants it sent in. Rx sent. ?

## 2022-02-07 DIAGNOSIS — D225 Melanocytic nevi of trunk: Secondary | ICD-10-CM | POA: Diagnosis not present

## 2022-02-07 DIAGNOSIS — D1801 Hemangioma of skin and subcutaneous tissue: Secondary | ICD-10-CM | POA: Diagnosis not present

## 2022-02-07 DIAGNOSIS — L821 Other seborrheic keratosis: Secondary | ICD-10-CM | POA: Diagnosis not present

## 2022-02-07 DIAGNOSIS — L814 Other melanin hyperpigmentation: Secondary | ICD-10-CM | POA: Diagnosis not present

## 2022-02-07 DIAGNOSIS — L2089 Other atopic dermatitis: Secondary | ICD-10-CM | POA: Diagnosis not present

## 2022-02-08 ENCOUNTER — Telehealth: Payer: Self-pay

## 2022-02-08 NOTE — Progress Notes (Signed)
? ? ?  Chronic Care Management ?Pharmacy Assistant  ? ?Name: Anita Cooper  MRN: [id number] DOB: 06-Sep-1951 ? ?Reason for Encounter: CCM Counsellor) ?  ?Medications: ?Outpatient Encounter Medications as of 02/08/2022  ?Medication Sig  ? aspirin 81 MG tablet Take 81 mg by mouth daily.  ? Blood Glucose Monitoring Suppl (ONE TOUCH ULTRA 2) w/Device KIT Use to check blood sugar daily. Dx E11.9  ? esomeprazole (NEXIUM) 40 MG capsule TAKE 1 CAPSULE BY MOUTH EVERY DAY BEFORE BREAKFAST  ? fexofenadine (ALLEGRA) 180 MG tablet Take 180 mg by mouth daily.  ? fluticasone (FLONASE) 50 MCG/ACT nasal spray Place 1-2 sprays into both nostrils daily.  ? glucose blood test strip Use as instructed to check sugar daily.  Dx E11.9.  one touch strips.  ? ibuprofen (ADVIL) 200 MG tablet Take 400 mg by mouth every 6 (six) hours as needed.  ? metFORMIN (GLUCOPHAGE) 500 MG tablet Take 1 tablet (500 mg total) by mouth 2 (two) times daily with a meal.  ? Multiple Vitamin (MULTIVITAMIN) tablet Take 1 tablet by mouth daily.  ? olmesartan-hydrochlorothiazide (BENICAR HCT) 40-25 MG tablet TAKE 1 TABLET BY MOUTH DAILY  ? rosuvastatin (CRESTOR) 5 MG tablet Take 1 tablet (5 mg total) by mouth daily.  ? triamcinolone cream (KENALOG) 0.1 % Apply 1 application topically 2 (two) times daily.  ? ?No facility-administered encounter medications on file as of 02/08/2022.  ? ?[name] was contacted to remind of upcoming telephone visit with Charlene Brooke on 02/15/2022 at 3:45. Patient was reminded to have any blood glucose and blood pressure readings available for review at appointment.  ? ?Message was left reminding patient of appointment. ? ?Star Rating Drugs: ?Medication:    Last Fill: Day Supply ?Olmesartan-HCTZ 40-25 mg  01/20/2022     90                     ?Metformin 500 mg   02/07/2022      90                                 ?Rosuvastatin 5 mg   12/10/2021      90        ? ?Charlene Brooke, CPP notified ? ?Marijean Niemann,  RMA ?Clinical Pharmacy Assistant ?[phone] ? ? ? ? ?

## 2022-02-15 ENCOUNTER — Telehealth: Payer: PPO

## 2022-02-24 ENCOUNTER — Other Ambulatory Visit (HOSPITAL_COMMUNITY): Payer: Self-pay | Admitting: Neurosurgery

## 2022-02-24 ENCOUNTER — Other Ambulatory Visit: Payer: Self-pay | Admitting: Neurosurgery

## 2022-02-24 DIAGNOSIS — D3614 Benign neoplasm of peripheral nerves and autonomic nervous system of thorax: Secondary | ICD-10-CM

## 2022-03-08 ENCOUNTER — Encounter: Payer: Self-pay | Admitting: Family Medicine

## 2022-03-14 ENCOUNTER — Telehealth: Payer: Self-pay

## 2022-03-14 NOTE — Progress Notes (Signed)
    Chronic Care Management Pharmacy Assistant   Name: Anita Cooper  MRN: [id number] DOB: 1951/05/14  Reason for Encounter: CCM (Diabetes Disease State)   Recent office visits:  01/20/22 Anita Stain, MD Diabetes: Change: Metfromin 500 mg BID vs.1,000 mg. Change: Olmesartan 40-25 mg 1 tablet daily vs. 1/2 tablet.   Recent consult visits:  01/23/22 - Colonoscopy  Hospital visits:  None in previous 6 months  Medications: Outpatient Encounter Medications as of 03/14/2022  Medication Sig   aspirin 81 MG tablet Take 81 mg by mouth daily.   Blood Glucose Monitoring Suppl (ONE TOUCH ULTRA 2) w/Device KIT Use to check blood sugar daily. Dx E11.9   esomeprazole (NEXIUM) 40 MG capsule TAKE 1 CAPSULE BY MOUTH EVERY DAY BEFORE BREAKFAST   fexofenadine (ALLEGRA) 180 MG tablet Take 180 mg by mouth daily.   fluticasone (FLONASE) 50 MCG/ACT nasal spray Place 1-2 sprays into both nostrils daily.   glucose blood test strip Use as instructed to check sugar daily.  Dx E11.9.  one touch strips.   ibuprofen (ADVIL) 200 MG tablet Take 400 mg by mouth every 6 (six) hours as needed.   metFORMIN (GLUCOPHAGE) 500 MG tablet Take 1 tablet (500 mg total) by mouth 2 (two) times daily with a meal.   Multiple Vitamin (MULTIVITAMIN) tablet Take 1 tablet by mouth daily.   olmesartan-hydrochlorothiazide (BENICAR HCT) 40-25 MG tablet TAKE 1 TABLET BY MOUTH DAILY   rosuvastatin (CRESTOR) 5 MG tablet Take 1 tablet (5 mg total) by mouth daily.   triamcinolone cream (KENALOG) 0.1 % Apply 1 application topically 2 (two) times daily.   No facility-administered encounter medications on file as of 03/14/2022.   Recent Relevant Labs: Lab Results  Component Value Date/Time   HGBA1C 7.6 (H) 01/02/2022 08:57 AM   HGBA1C 7.3 (H) 09/08/2021 09:16 AM    Kidney Function Lab Results  Component Value Date/Time   CREATININE 0.87 01/02/2022 08:57 AM   CREATININE 0.91 09/08/2021 09:16 AM   GFR 67.87 01/02/2022 08:57 AM    GFRNONAA 72.94 08/19/2010 11:06 AM   GFRAA  12/14/2009 03:45 PM    >60        The eGFR has been calculated using the MDRD equation. This calculation has not been validated in all clinical situations. eGFR's persistently <60 mL/min signify possible Chronic Kidney Disease.   Attempted contact with patient 3 times on 05/03, 05/09 and 05/11. Unsuccessful outreach. Will atttempt contact next month.  Current antihyperglycemic regimen:  Metformin 500 mg Blood sugar above 100 take 1 Metformin twice a day; below 100 stop taking totally    Adherence Review: Is the patient currently on a STATIN medication? Yes Is the patient currently on ACE/ARB medication? Yes Does the patient have >5 day gap between last estimated fill dates? Yes Rosuvastatin 5 mg  Care Gaps: Annual wellness visit in last year? Yes 09/22/2021 Most recent A1C reading: 7.6 on 01/02/2022 Most Recent BP reading: 138/78 on 01/20/2022  Last eye exam / retinopathy screening: Up to date Last diabetic foot exam: Up to date  Star Rating Drugs:  Medication:   Last Fill: Day Supply Metformin 500 mg  02/25/2022 90 Olmesartan-HCTZ 40-25 mg 01/20/2022 90 Rosuvastatin 5 mg  12/10/2021 90 Fill dates verified with [facility]   No appointments scheduled within the next 30 days.  Charlene Brooke, CPP notified  Marijean Niemann, [place] Clinical Pharmacy Assistant [phone]

## 2022-03-20 ENCOUNTER — Ambulatory Visit
Admission: RE | Admit: 2022-03-20 | Discharge: 2022-03-20 | Disposition: A | Discharge status: Home / Self Care | Payer: PPO | Source: Ambulatory Visit | Attending: Neurosurgery | Admitting: Neurosurgery

## 2022-03-20 DIAGNOSIS — D3614 Benign neoplasm of peripheral nerves and autonomic nervous system of thorax: Secondary | ICD-10-CM | POA: Insufficient documentation

## 2022-03-20 DIAGNOSIS — M40204 Unspecified kyphosis, thoracic region: Secondary | ICD-10-CM | POA: Diagnosis not present

## 2022-03-20 MED ORDER — GADOBUTROL 1 MMOL/ML IV SOLN
9.0000 mL | Freq: Once | INTRAVENOUS | Status: AC | PRN
  Administered 2022-03-20: 9 mL via INTRAVENOUS

## 2022-03-27 DIAGNOSIS — Z780 Asymptomatic menopausal state: Secondary | ICD-10-CM | POA: Diagnosis not present

## 2022-03-27 LAB — HM DEXA SCAN: HM Dexa Scan: NORMAL

## 2022-04-03 DIAGNOSIS — Z6837 Body mass index (BMI) 37.0-37.9, adult: Secondary | ICD-10-CM | POA: Diagnosis not present

## 2022-04-03 DIAGNOSIS — D3614 Benign neoplasm of peripheral nerves and autonomic nervous system of thorax: Secondary | ICD-10-CM | POA: Diagnosis not present

## 2022-04-13 ENCOUNTER — Telehealth: Payer: Self-pay

## 2022-04-13 NOTE — Progress Notes (Signed)
Chronic Care Management Pharmacy Assistant   Name: Anita Cooper  MRN: [id number] DOB: October 16, 1951  Reason for Encounter: CCM (Diabetes Disease State)   Recent office visits:  None since last CCM contact'  Recent consult visits:  03/20/22 [facility] visits:  None since last CCM contact'  Medications: Outpatient Encounter Medications as of 04/13/2022  Medication Sig   aspirin 81 MG tablet Take 81 mg by mouth daily.   Blood Glucose Monitoring Suppl (ONE TOUCH ULTRA 2) w/Device KIT Use to check blood sugar daily. Dx E11.9   esomeprazole (NEXIUM) 40 MG capsule TAKE 1 CAPSULE BY MOUTH EVERY DAY BEFORE BREAKFAST   fexofenadine (ALLEGRA) 180 MG tablet Take 180 mg by mouth daily.   fluticasone (FLONASE) 50 MCG/ACT nasal spray Place 1-2 sprays into both nostrils daily.   glucose blood test strip Use as instructed to check sugar daily.  Dx E11.9.  one touch strips.   ibuprofen (ADVIL) 200 MG tablet Take 400 mg by mouth every 6 (six) hours as needed.   metFORMIN (GLUCOPHAGE) 500 MG tablet Take 1 tablet (500 mg total) by mouth 2 (two) times daily with a meal.   Multiple Vitamin (MULTIVITAMIN) tablet Take 1 tablet by mouth daily.   olmesartan-hydrochlorothiazide (BENICAR HCT) 40-25 MG tablet TAKE 1 TABLET BY MOUTH DAILY   rosuvastatin (CRESTOR) 5 MG tablet Take 1 tablet (5 mg total) by mouth daily.   triamcinolone cream (KENALOG) 0.1 % Apply 1 application topically 2 (two) times daily.   No facility-administered encounter medications on file as of 04/13/2022.   Recent Relevant Labs: Lab Results  Component Value Date/Time   HGBA1C 7.6 (H) 01/02/2022 08:57 AM   HGBA1C 7.3 (H) 09/08/2021 09:16 AM    Kidney Function Lab Results  Component Value Date/Time   CREATININE 0.87 01/02/2022 08:57 AM   CREATININE 0.91 09/08/2021 09:16 AM   GFR 67.87 01/02/2022 08:57 AM   GFRNONAA 72.94 08/19/2010 11:06 AM   GFRAA  12/14/2009 03:45 PM    >60        The eGFR has been  calculated using the MDRD equation. This calculation has not been validated in all clinical situations. eGFR's persistently <60 mL/min signify possible Chronic Kidney Disease.   Attempted contact with patient 3 times. Unsuccessful outreach. Will atttempt contact next month.  Current antihyperglycemic regimen:  Metformin 500 mg Blood sugar above 100 take 1 Metformin twice a day; below 100 stop taking totally  Adherence Review: Is the patient currently on a STATIN medication? Yes Is the patient currently on ACE/ARB medication? Yes Does the patient have >5 day gap between last estimated fill dates? No       Care Gaps: Annual wellness visit in last year? Yes 09/22/2021 Most recent A1C reading: 7.6 on 01/02/2022 Most Recent BP reading: 138/78 on 01/20/2022   Last eye exam / retinopathy screening: Up to date Last diabetic foot exam: Up to date   Star Rating Drugs:  Medication:                            Last Fill:         Day Supply Metformin 500 mg                   02/25/2022      90 Olmesartan-HCTZ 40-25 mg  01/20/2022      90 Rosuvastatin 5 mg  03/16/2022      90 [facility]    No appointments scheduled within the next 30 days.   Charlene Brooke, CPP notified   Marijean Niemann, [place] Clinical Pharmacy Assistant [phone]

## 2022-08-16 DIAGNOSIS — Z1231 Encounter for screening mammogram for malignant neoplasm of breast: Secondary | ICD-10-CM | POA: Diagnosis not present

## 2022-08-16 LAB — HM MAMMOGRAPHY

## 2022-08-18 ENCOUNTER — Other Ambulatory Visit: Payer: Self-pay | Admitting: Neurosurgery

## 2022-08-18 DIAGNOSIS — D3614 Benign neoplasm of peripheral nerves and autonomic nervous system of thorax: Secondary | ICD-10-CM

## 2022-09-08 ENCOUNTER — Ambulatory Visit
Admission: RE | Admit: 2022-09-08 | Discharge: 2022-09-08 | Disposition: A | Discharge status: Home / Self Care | Payer: PPO | Source: Ambulatory Visit | Attending: Neurosurgery | Admitting: Neurosurgery

## 2022-09-08 DIAGNOSIS — D3614 Benign neoplasm of peripheral nerves and autonomic nervous system of thorax: Secondary | ICD-10-CM | POA: Diagnosis not present

## 2022-09-08 DIAGNOSIS — Z853 Personal history of malignant neoplasm of breast: Secondary | ICD-10-CM | POA: Diagnosis not present

## 2022-09-08 DIAGNOSIS — M5134 Other intervertebral disc degeneration, thoracic region: Secondary | ICD-10-CM | POA: Diagnosis not present

## 2022-09-08 DIAGNOSIS — M4804 Spinal stenosis, thoracic region: Secondary | ICD-10-CM | POA: Diagnosis not present

## 2022-09-08 DIAGNOSIS — M50223 Other cervical disc displacement at C6-C7 level: Secondary | ICD-10-CM | POA: Diagnosis not present

## 2022-09-08 MED ORDER — GADOBUTROL 1 MMOL/ML IV SOLN
9.0000 mL | Freq: Once | INTRAVENOUS | Status: AC | PRN
  Administered 2022-09-08: 9 mL via INTRAVENOUS

## 2022-09-10 ENCOUNTER — Other Ambulatory Visit: Payer: Self-pay | Admitting: Family Medicine

## 2022-09-12 ENCOUNTER — Other Ambulatory Visit: Payer: Self-pay | Admitting: Family Medicine

## 2022-09-12 DIAGNOSIS — E119 Type 2 diabetes mellitus without complications: Secondary | ICD-10-CM

## 2022-09-13 ENCOUNTER — Telehealth: Payer: Self-pay | Admitting: Family Medicine

## 2022-09-13 NOTE — Telephone Encounter (Signed)
LVM for pt informing her that I scheduled her AWV call for 09/25/22 at 12:30 pm with Almyra Free, and if this does not work for her to please call me back to re-schedule @ [phone]

## 2022-09-19 ENCOUNTER — Other Ambulatory Visit (INDEPENDENT_AMBULATORY_CARE_PROVIDER_SITE_OTHER): Payer: PPO

## 2022-09-19 DIAGNOSIS — E119 Type 2 diabetes mellitus without complications: Secondary | ICD-10-CM | POA: Diagnosis not present

## 2022-09-19 LAB — COMPREHENSIVE METABOLIC PANEL
ALT: 35 U/L (ref 0–35)
AST: 37 U/L (ref 0–37)
Albumin: 4 g/dL (ref 3.5–5.2)
Alkaline Phosphatase: 32 U/L — ABNORMAL LOW (ref 39–117)
BUN: 16 mg/dL (ref 6–23)
CO2: 32 mEq/L (ref 19–32)
Calcium: 9.8 mg/dL (ref 8.4–10.5)
Chloride: 99 mEq/L (ref 96–112)
Creatinine, Ser: 0.98 mg/dL (ref 0.40–1.20)
GFR: 58.54 mL/min — ABNORMAL LOW (ref >60.00)
Glucose, Bld: 143 mg/dL — ABNORMAL HIGH (ref 70–99)
Potassium: 4.3 mEq/L (ref 3.5–5.1)
Sodium: 139 mEq/L (ref 135–145)
Total Bilirubin: 0.4 mg/dL (ref 0.2–1.2)
Total Protein: 6.8 g/dL (ref 6.0–8.3)

## 2022-09-19 LAB — LIPID PANEL
Cholesterol: 112 mg/dL (ref 0–200)
HDL: 49.5 mg/dL (ref >39.00)
LDL Cholesterol: 41 mg/dL (ref 0–99)
NonHDL: 62.38
Total CHOL/HDL Ratio: 2
Triglycerides: 109 mg/dL (ref 0.0–149.0)
VLDL: 21.8 mg/dL (ref 0.0–40.0)

## 2022-09-19 LAB — HEMOGLOBIN A1C: Hgb A1c MFr Bld: 8 % — ABNORMAL HIGH (ref 4.6–6.5)

## 2022-09-20 DIAGNOSIS — C50911 Malignant neoplasm of unspecified site of right female breast: Secondary | ICD-10-CM | POA: Diagnosis not present

## 2022-09-20 DIAGNOSIS — Z853 Personal history of malignant neoplasm of breast: Secondary | ICD-10-CM | POA: Diagnosis not present

## 2022-09-25 ENCOUNTER — Ambulatory Visit (INDEPENDENT_AMBULATORY_CARE_PROVIDER_SITE_OTHER): Payer: PPO

## 2022-09-25 VITALS — Wt 196.0 lb

## 2022-09-25 DIAGNOSIS — M65311 Trigger thumb, right thumb: Secondary | ICD-10-CM | POA: Diagnosis not present

## 2022-09-25 DIAGNOSIS — D3614 Benign neoplasm of peripheral nerves and autonomic nervous system of thorax: Secondary | ICD-10-CM | POA: Diagnosis not present

## 2022-09-25 DIAGNOSIS — M65331 Trigger finger, right middle finger: Secondary | ICD-10-CM | POA: Diagnosis not present

## 2022-09-25 DIAGNOSIS — Z6838 Body mass index (BMI) 38.0-38.9, adult: Secondary | ICD-10-CM | POA: Diagnosis not present

## 2022-09-25 DIAGNOSIS — Z0000 Encounter for general adult medical examination without abnormal findings: Secondary | ICD-10-CM

## 2022-09-25 NOTE — Progress Notes (Signed)
Virtual Visit via Telephone Note  I connected with  Anita Cooper on 09/25/22 at  3:30 PM EST by telephone and verified that I am speaking with the correct person using two identifiers.  Location: Patient: home Provider: Talmo Persons participating in the virtual visit: [place]   I discussed the limitations, risks, security and privacy concerns of performing an evaluation and management service by telephone and the availability of in person appointments. The patient expressed understanding and agreed to proceed.  Interactive audio and video telecommunications were attempted between this nurse and patient, however failed, due to patient having technical difficulties OR patient did not have access to video capability.  We continued and completed visit with audio only.  Some vital signs may be absent or patient reported.   Dionisio David, LPN  Subjective:   Anita Cooper is a 71 y.o. female who presents for Medicare Annual (Subsequent) preventive examination.  Review of Systems     Cardiac Risk Factors include: advanced age (>47mn, >>76women);diabetes mellitus;hypertension;dyslipidemia     Objective:    There were no vitals filed for this visit. There is no height or weight on file to calculate BMI.     09/25/2022    3:31 PM 01/11/2018    4:22 PM  Advanced Directives  Does Patient Have a Medical Advance Directive? No Yes  Type of [place]porate [place]f [place] will  Would patient like information on creating a medical advance directive? No - Patient declined     Current Medications (verified) Outpatient Encounter Medications as of 09/25/2022  Medication Sig   aspirin 81 MG tablet Take 81 mg by mouth daily.   Blood Glucose Monitoring Suppl (ONE TOUCH ULTRA 2) w/Device KIT Use to check blood sugar daily. Dx E11.9   esomeprazole (NEXIUM) 40 MG capsule TAKE 1 CAPSULE BY MOUTH EVERY DAY BEFORE BREAKFAST    fexofenadine (ALLEGRA) 180 MG tablet Take 180 mg by mouth daily.   fluticasone (FLONASE) 50 MCG/ACT nasal spray Place 1-2 sprays into both nostrils daily.   glucose blood test strip Use as instructed to check sugar daily.  Dx E11.9.  one touch strips.   ibuprofen (ADVIL) 200 MG tablet Take 400 mg by mouth every 6 (six) hours as needed.   metFORMIN (GLUCOPHAGE) 500 MG tablet Take 1 tablet (500 mg total) by mouth 2 (two) times daily with a meal.   Multiple Vitamin (MULTIVITAMIN) tablet Take 1 tablet by mouth daily.   olmesartan-hydrochlorothiazide (BENICAR HCT) 40-25 MG tablet TAKE 1 TABLET BY MOUTH DAILY   rosuvastatin (CRESTOR) 5 MG tablet TAKE 1 TABLET (5 MG TOTAL) BY MOUTH DAILY.   triamcinolone cream (KENALOG) 0.1 % Apply 1 application topically 2 (two) times daily.   No facility-administered encounter medications on file as of 09/25/2022.    Allergies (verified) Oseltamivir, Benadryl [diphenhydramine hcl], Losartan, and Pravastatin   History: Past Medical History:  Diagnosis Date   Arthritis    Cancer ([place] 2011 - Released by Dr. MJana Hakim2012   stage 0 right breast S/P lumpectomy and sentinel node biopsy and radiation - per Dr. WDonne Hazel   Carpal tunnel syndrome    Diabetes mellitus without complication (HCC)    Elevated liver function tests    Mild increase due to fatty liver seen on [place] normal after weight loss   GERD (gastroesophageal reflux disease)    Lack of relief with Prilosec and other OTC PPI's   Hyperlipidemia    Hypertension  Benign   Past Surgical History:  Procedure Laterality Date   BREAST LUMPECTOMY  2011   right lumpectomy, sentinel node biopsy   KNEE ARTHROSCOPY     Bilaterally   Family History  Problem Relation Age of Onset   Cancer Father        Lung   Breast cancer Daughter    Colon cancer Neg Hx    Social History   Socioeconomic History   Marital status: Married    Spouse name: Not on file   Number of children: 2   Years of education:  Not on file   Highest education level: Not on file  Occupational History   Occupation: Full time  Tobacco Use   Smoking status: Never   Smokeless tobacco: Never  Vaping Use   Vaping Use: Never used  Substance and Sexual Activity   Alcohol use: No    Alcohol/week: 0.0 standard drinks of alcohol   Drug use: No   Sexual activity: Not on file  Other Topics Concern   Not on file  Social History Narrative   Reads for pleasure.   2 kids, 5 grandkids   Retired 2015   Married [place] Strain: [place]  (09/25/2022)   Overall Financial Resource Strain (CARDIA)    Difficulty of Paying Living Expenses: Not hard at all  Food Insecurity: No Food Insecurity (09/25/2022)   Hunger Vital Sign    Worried About Running Out of Food in the Last Year: Never true    Ran Out of Food in the Last Year: Never true  Transportation Needs: No Transportation Needs (09/25/2022)   PRAPARE - Hydrologist (Medical): No    Lack of Transportation (Non-Medical): No  Physical Activity: Insufficiently Active (09/25/2022)   Exercise Vital Sign    Days of Exercise per Week: 2 days    Minutes of Exercise per Session: 60 min  Stress: No Stress Concern Present (09/25/2022)   [place]    Feeling of Stress : Not at all  Social Connections: Moderately Integrated (09/25/2022)   Social Connection and Isolation Panel [NHANES]    Frequency of Communication with Friends and Family: More than three times a week    Frequency of Social Gatherings with Friends and Family: Three times a week    Attends Religious Services: More than 4 times per year    Active Member of Clubs or Organizations: No    Attends Archivist Meetings: Never    Marital Status: Married    Tobacco Counseling Counseling given: Not Answered   Clinical Intake:  Pre-visit preparation completed:  Yes  Pain : No/denies pain     Nutritional Risks: None Diabetes: Yes CBG done?: No Did pt. bring in CBG monitor from home?: No  How often do you need to have someone help you when you read instructions, pamphlets, or other written materials from your doctor or pharmacy?: 1 - Never  Diabetic?yes Nutrition Risk Assessment:  Has the patient had any N/V/D within the last 2 months?  No  Does the patient have any non-healing wounds?  No  Has the patient had any unintentional weight loss or weight gain?  No   Diabetes:  Is the patient diabetic?  Yes  If diabetic, was a CBG obtained today?  No  Did the patient bring in their glucometer from home?  No  How often do you  monitor your CBG's? occasionally.   Financial Strains and Diabetes Management:  Are you having any financial strains with the device, your supplies or your medication? No .  Does the patient want to be seen by Chronic Care Management for management of their diabetes?  No  Would the patient like to be referred to a Nutritionist or for Diabetic Management?  No   Diabetic Exams:  Diabetic Eye Exam: Completed 12/2721. Pt has been advised about the importance in completing this exam.  Diabetic Foot Exam: Completed 5/16/252. Pt has been advised about the importance in completing this exam.   Interpreter Needed?: No  Information entered by :: Kirke Shaggy, LPN   Activities of Daily Living    09/25/2022    3:32 PM  In your present state of health, do you have any difficulty performing the following activities:  Hearing? 0  Vision? 0  Difficulty concentrating or making decisions? 0  Walking or climbing stairs? 0  Dressing or bathing? 0  Doing errands, shopping? 0  Preparing Food and eating ? N  Using the Toilet? N  In the past six months, have you accidently leaked urine? N  Do you have problems with loss of bowel control? N  Managing your Medications? N  Managing your Finances? N  Housekeeping or managing  your Housekeeping? N    Patient Care Team: Tonia Ghent, MD as PCP - Claris Gower, [facility] as Pharmacist (Pharmacist) Delsa Sale, OD (Optometry) Richmond Campbell, MD as Consulting Physician (Gastroenterology)  Indicate any recent Medical Services you may have received from other than [facility] providers in the past year (date may be approximate).     Assessment:   This is a routine wellness examination for Anita Cooper.  Hearing/Vision screen Hearing Screening - Comments:: No aids Vision Screening - Comments:: Contacts- Dr.Miller  Dietary issues and exercise activities discussed: Current Exercise Habits: Home exercise routine, Type of exercise: Other - see comments (dance class), Time (Minutes): 60, Frequency (Times/Week): 2, Weekly Exercise (Minutes/Week): 120   Goals Addressed             This Visit's Progress    DIET - EAT MORE FRUITS AND VEGETABLES         Depression Screen    09/25/2022    3:30 PM 09/16/2021    3:59 PM 09/13/2020    8:41 AM 06/03/2019   10:56 AM 05/23/2018    2:08 PM  PHQ 2/9 Scores  PHQ - 2 Score 0 0 0 0 0  PHQ- 9 Score 0        Fall Risk    09/25/2022    3:32 PM 09/22/2021    9:37 AM 12/14/2020    9:29 AM 09/13/2020    8:41 AM 06/03/2019   10:56 AM  Fall Risk   Falls in the past year? 0 0 0 0 0  Number falls in past yr: 0 0 0    Injury with Fall? 0 0 0    Risk for fall due to : No Fall Risks No Fall Risks No Fall Risks    Follow up Falls prevention discussed;Falls evaluation completed Falls evaluation completed Falls evaluation completed      FALL RISK PREVENTION PERTAINING TO THE HOME:  Any stairs in or around the home? No  If so, are there any without handrails? No  Home free of loose throw rugs in walkways, pet beds, electrical cords, etc? Yes  Adequate lighting in your home to reduce risk of falls? Yes  ASSISTIVE DEVICES UTILIZED TO PREVENT FALLS:  Life alert? No  Use of a cane, walker or w/c? No  Grab bars in the  bathroom? No  Shower chair or bench in shower? No  Elevated toilet seat or a handicapped toilet? Yes    Cognitive Function:        09/25/2022    3:32 PM  6CIT Screen  What Year? 0 points  What month? 0 points  What time? 0 points  Count back from 20 0 points  Months in reverse 0 points  Repeat phrase 0 points  Total Score 0 points    Immunizations Immunization History  Administered Date(s) Administered   Fluad Quad(high Dose 65+) 08/11/2021, 08/30/2022   Influenza Split 08/07/2013   Influenza Whole 08/13/2009, 08/05/2010   Influenza, High Dose Seasonal PF 09/06/2018, 08/21/2019, 08/21/2019   Influenza, Seasonal, Injecte, Preservative Fre 08/17/2015   Influenza-Unspecified 08/14/2017, 09/06/2018, 08/23/2020   Moderna Covid-19 Vaccine Bivalent Booster 36yr & up 02/07/2022   PFIZER Comirnaty(Gray Top)Covid-19 Tri-Sucrose Vaccine 06/24/2021, 08/30/2022   PFIZER(Purple Top)SARS-COV-2 Vaccination 12/19/2019, 01/13/2020, 08/26/2020   Pneumococcal Conjugate-13 05/23/2018   Pneumococcal Polysaccharide-23 08/02/2015, 03/28/2021   Td 11/14/2003   Tdap 09/29/2013   Zoster Recombinat (Shingrix) 01/09/2019, 07/23/2019   Zoster, Live 09/29/2013    TDAP status: Up to date  Flu Vaccine status: Up to date  Pneumococcal vaccine status: Up to date  Covid-19 vaccine status: Completed vaccines  Qualifies for Shingles Vaccine? Yes   Zostavax completed Yes   Shingrix Completed?: Yes  Screening Tests Health Maintenance  Topic Date Due   Diabetic kidney evaluation - Urine ACR  Never done   FOOT EXAM  03/28/2022   OPHTHALMOLOGY EXAM  12/20/2022   COVID-19 Vaccine (6 - Pfizer series) 12/31/2022   HEMOGLOBIN A1C  03/20/2023   Diabetic kidney evaluation - GFR measurement  09/20/2023   Medicare Annual Wellness (AWV)  09/26/2023   TETANUS/TDAP  09/30/2023   MAMMOGRAM  08/16/2024   COLONOSCOPY (Pts 45-452yrInsurance coverage will need to be confirmed)  01/24/2025   Pneumonia  Vaccine 6579Years old  Completed   INFLUENZA VACCINE  Completed   DEXA SCAN  Completed   Hepatitis C Screening  Completed   Zoster Vaccines- Shingrix  Completed   HPV VACCINES  Aged Out    Health Maintenance  Health Maintenance Due  Topic Date Due   Diabetic kidney evaluation - Urine ACR  Never done   FOOT EXAM  03/28/2022    Colorectal cancer screening: Type of screening: Colonoscopy. Completed 01/24/22. Repeat every 3 years  Mammogram status: Completed 08/16/22. Repeat every year  Bone Density status: Completed 03/27/22. Results reflect: Bone density results: NORMAL. Repeat every 5 years.  Lung Cancer Screening: (Low Dose CT Chest recommended if Age 70-80ears, 30 pack-year currently smoking OR have quit w/in 15years.) does not qualify.   Additional Screening:  Hepatitis C Screening: does qualify; Completed 02/16/16  Vision Screening: Recommended annual ophthalmology exams for early detection of glaucoma and other disorders of the eye. Is the patient up to date with their annual eye exam?  Yes  Who is the provider or what is the name of the office in which the patient attends annual eye exams? Dr.Miller If pt is not established with a provider, would they like to be referred to a provider to establish care? No .   Dental Screening: Recommended annual dental exams for proper oral hygiene  Community Resource Referral / Chronic Care Management: CRR required this visit?  No  CCM required this visit?  No      Plan:     I have personally reviewed and noted the following in the patient's chart:   Medical and social history Use of alcohol, tobacco or illicit drugs  Current medications and supplements including opioid prescriptions. Patient is not currently taking opioid prescriptions. Functional ability and status Nutritional status Physical activity Advanced directives List of other physicians Hospitalizations, surgeries, and ER visits in previous 12  months Vitals Screenings to include cognitive, depression, and falls Referrals and appointments  In addition, I have reviewed and discussed with patient certain preventive protocols, quality metrics, and best practice recommendations. A written personalized care plan for preventive services as well as general preventive health recommendations were provided to patient.     Dionisio David, LPN   [id number]   Nurse Notes: none

## 2022-09-25 NOTE — Patient Instructions (Signed)
Anita Cooper , Thank you for taking time to come for your Medicare Wellness Visit. I appreciate your ongoing commitment to your health goals. Please review the following plan we discussed and let me know if I can assist you in the future.   Screening recommendations/referrals: Colonoscopy: 01/24/22 Mammogram: 08/16/22 Bone Density: 03/27/22 Recommended yearly ophthalmology/optometry visit for glaucoma screening and checkup Recommended yearly dental visit for hygiene and checkup  Vaccinations: Influenza vaccine: 08/30/22 Pneumococcal vaccine: 03/28/21 Tdap vaccine: 09/29/13 Shingles vaccine: Zostavax    Covid-19:09/29/13  Advanced directives: no  Conditions/risks identified: none  Next appointment: Follow up in one year for your annual wellness visit 09/27/23 @ 11:30 am by phone   Preventive Care 65 Years and Older, Female Preventive care refers to lifestyle choices and visits with your health care provider that can promote health and wellness. What does preventive care include? A yearly physical exam. This is also called an annual well check. Dental exams once or twice a year. Routine eye exams. Ask your health care provider how often you should have your eyes checked. Personal lifestyle choices, including: Daily care of your teeth and gums. Regular physical activity. Eating a healthy diet. Avoiding tobacco and drug use. Limiting alcohol use. Practicing safe sex. Taking low-dose aspirin every day. Taking vitamin and mineral supplements as recommended by your health care provider. What happens during an annual well check? The services and screenings done by your health care provider during your annual well check will depend on your age, overall health, lifestyle risk factors, and family history of disease. Counseling  Your health care provider may ask you questions about your: Alcohol use. Tobacco use. Drug use. Emotional well-being. Home and relationship well-being. Sexual  activity. Eating habits. History of falls. Memory and ability to understand (cognition). Work and work Statistician. Reproductive health. Screening  You may have the following tests or measurements: Height, weight, and BMI. Blood pressure. Lipid and cholesterol levels. These may be checked every 5 years, or more frequently if you are over 16 years old. Skin check. Lung cancer screening. You may have this screening every year starting at age 102 if you have a 30-pack-year history of smoking and currently smoke or have quit within the past 15 years. Fecal occult blood test (FOBT) of the stool. You may have this test every year starting at age 47. Flexible sigmoidoscopy or colonoscopy. You may have a sigmoidoscopy every 5 years or a colonoscopy every 10 years starting at age 64. Hepatitis C blood test. Hepatitis B blood test. Sexually transmitted disease (STD) testing. Diabetes screening. This is done by checking your blood sugar (glucose) after you have not eaten for a while (fasting). You may have this done every 1-3 years. Bone density scan. This is done to screen for osteoporosis. You may have this done starting at age 61. Mammogram. This may be done every 1-2 years. Talk to your health care provider about how often you should have regular mammograms. Talk with your health care provider about your test results, treatment options, and if necessary, the need for more tests. Vaccines  Your health care provider may recommend certain vaccines, such as: Influenza vaccine. This is recommended every year. Tetanus, diphtheria, and acellular pertussis (Tdap, Td) vaccine. You may need a Td booster every 10 years. Zoster vaccine. You may need this after age 5. Pneumococcal 13-valent conjugate (PCV13) vaccine. One dose is recommended after age 40. Pneumococcal polysaccharide (PPSV23) vaccine. One dose is recommended after age 33. Talk to your health care provider  about which screenings and vaccines  you need and how often you need them. This information is not intended to replace advice given to you by your health care provider. Make sure you discuss any questions you have with your health care provider. Document Released: 11/26/2015 Document Revised: 07/19/2016 Document Reviewed: 08/31/2015 Elsevier Interactive Patient Education  2017 [place] Prevention in the Home Falls can cause injuries. They can happen to people of all ages. There are many things you can do to make your home safe and to help prevent falls. What can I do on the outside of my home? Regularly fix the edges of walkways and driveways and fix any cracks. Remove anything that might make you trip as you walk through a door, such as a raised step or threshold. Trim any bushes or trees on the path to your home. Use bright outdoor lighting. Clear any walking paths of anything that might make someone trip, such as rocks or tools. Regularly check to see if handrails are loose or broken. Make sure that both sides of any steps have handrails. Any raised decks and porches should have guardrails on the edges. Have any leaves, snow, or ice cleared regularly. Use sand or salt on walking paths during winter. Clean up any spills in your garage right away. This includes oil or grease spills. What can I do in the bathroom? Use night lights. Install grab bars by the toilet and in the tub and shower. Do not use towel bars as grab bars. Use non-skid mats or decals in the tub or shower. If you need to sit down in the shower, use a plastic, non-slip stool. Keep the floor dry. Clean up any water that spills on the floor as soon as it happens. Remove soap buildup in the tub or shower regularly. Attach bath mats securely with double-sided non-slip rug tape. Do not have throw rugs and other things on the floor that can make you trip. What can I do in the bedroom? Use night lights. Make sure that you have a light by your bed that  is easy to reach. Do not use any sheets or blankets that are too big for your bed. They should not hang down onto the floor. Have a firm chair that has side arms. You can use this for support while you get dressed. Do not have throw rugs and other things on the floor that can make you trip. What can I do in the kitchen? Clean up any spills right away. Avoid walking on wet floors. Keep items that you use a lot in easy-to-reach places. If you need to reach something above you, use a strong step stool that has a grab bar. Keep electrical cords out of the way. Do not use floor polish or wax that makes floors slippery. If you must use wax, use non-skid floor wax. Do not have throw rugs and other things on the floor that can make you trip. What can I do with my stairs? Do not leave any items on the stairs. Make sure that there are handrails on both sides of the stairs and use them. Fix handrails that are broken or loose. Make sure that handrails are as long as the stairways. Check any carpeting to make sure that it is firmly attached to the stairs. Fix any carpet that is loose or worn. Avoid having throw rugs at the top or bottom of the stairs. If you do have throw rugs, attach them to the floor  with carpet tape. Make sure that you have a light switch at the top of the stairs and the bottom of the stairs. If you do not have them, ask someone to add them for you. What else can I do to help prevent falls? Wear shoes that: Do not have high heels. Have rubber bottoms. Are comfortable and fit you well. Are closed at the toe. Do not wear sandals. If you use a stepladder: Make sure that it is fully opened. Do not climb a closed stepladder. Make sure that both sides of the stepladder are locked into place. Ask someone to hold it for you, if possible. Clearly mark and make sure that you can see: Any grab bars or handrails. First and last steps. Where the edge of each step is. Use tools that help you  move around (mobility aids) if they are needed. These include: Canes. Walkers. Scooters. Crutches. Turn on the lights when you go into a dark area. Replace any light bulbs as soon as they burn out. Set up your furniture so you have a clear path. Avoid moving your furniture around. If any of your floors are uneven, fix them. If there are any pets around you, be aware of where they are. Review your medicines with your doctor. Some medicines can make you feel dizzy. This can increase your chance of falling. Ask your doctor what other things that you can do to help prevent falls. This information is not intended to replace advice given to you by your health care provider. Make sure you discuss any questions you have with your health care provider. Document Released: 08/26/2009 Document Revised: 04/06/2016 Document Reviewed: 12/04/2014 Elsevier Interactive Patient Education  2017 [employer].

## 2022-09-26 ENCOUNTER — Encounter: Payer: Self-pay | Admitting: Family Medicine

## 2022-09-26 ENCOUNTER — Ambulatory Visit (INDEPENDENT_AMBULATORY_CARE_PROVIDER_SITE_OTHER): Payer: PPO | Admitting: Family Medicine

## 2022-09-26 VITALS — BP 138/70 | HR 74 | Temp 98.2°F | Ht 61.0 in | Wt 200.0 lb

## 2022-09-26 DIAGNOSIS — E119 Type 2 diabetes mellitus without complications: Secondary | ICD-10-CM | POA: Diagnosis not present

## 2022-09-26 DIAGNOSIS — I10 Essential (primary) hypertension: Secondary | ICD-10-CM

## 2022-09-26 DIAGNOSIS — R222 Localized swelling, mass and lump, trunk: Secondary | ICD-10-CM

## 2022-09-26 DIAGNOSIS — E7800 Pure hypercholesterolemia, unspecified: Secondary | ICD-10-CM

## 2022-09-26 DIAGNOSIS — K219 Gastro-esophageal reflux disease without esophagitis: Secondary | ICD-10-CM | POA: Diagnosis not present

## 2022-09-26 DIAGNOSIS — M25559 Pain in unspecified hip: Secondary | ICD-10-CM | POA: Diagnosis not present

## 2022-09-26 DIAGNOSIS — Z7189 Other specified counseling: Secondary | ICD-10-CM

## 2022-09-26 DIAGNOSIS — Z0000 Encounter for general adult medical examination without abnormal findings: Secondary | ICD-10-CM

## 2022-09-26 MED ORDER — METFORMIN HCL 500 MG PO TABS: 500.0000 mg | ORAL_TABLET | Freq: Two times a day (BID) | ORAL | 3 refills | Status: DC

## 2022-09-26 MED ORDER — FLUTICASONE PROPIONATE 50 MCG/ACT NA SUSP: 1.0000 | Freq: Every day | NASAL | 3 refills | Status: DC

## 2022-09-26 MED ORDER — OLMESARTAN MEDOXOMIL-HCTZ 40-25 MG PO TABS: ORAL_TABLET | ORAL | 3 refills | Status: DC

## 2022-09-26 MED ORDER — ESOMEPRAZOLE MAGNESIUM 40 MG PO CPDR
DELAYED_RELEASE_CAPSULE | ORAL | 3 refills | Status: DC
Start: 2022-09-26 — End: 2022-11-28

## 2022-09-26 MED ORDER — ROSUVASTATIN CALCIUM 5 MG PO TABS: 5.0000 mg | ORAL_TABLET | Freq: Every day | ORAL | 3 refills | Status: DC

## 2022-09-26 MED ORDER — FEXOFENADINE HCL 180 MG PO TABS: 180.0000 mg | ORAL_TABLET | Freq: Every day | ORAL | 3 refills | Status: DC

## 2022-09-26 NOTE — Patient Instructions (Signed)
Recheck A1c at a visit in about 6 months.   Update me as needed.  Take care.  Glad to see you.

## 2022-09-26 NOTE — Progress Notes (Unsigned)
Diabetes:  Using medications without difficulties:yes Hypoglycemic episodes: no Hyperglycemic episodes: no Feet problems: no Blood Sugars averaging: usually ~120-140s, occ up to 170s.   eye exam within last year: yes  Hypertension:    Using medication without problems or lightheadedness: yes Chest pain with exertion:no Edema:no Short of breath:no Her BP was reading higher on her home cuff, clearly lower on out cuff here.    She has some occ L shoulder pain at rest, positional, laying a certain way at night, change in position helps.  She did PT and that helped.    Elevated Cholesterol: Using medications without problems: yes Muscle aches: no Diet compliance: d/w pt.   Exercise: d/w pt.    R 3rd finger triggering.  She had ortho eval in the meantime.  She had prev injection.    GERD controlled unless she has trigger foods.  Taking PPI at baseline.    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 She had and will have neurosurgery f/u.    H/o bursitis, esp in the summer. More pain walking on sand at the beach.  She has access site but needs a form for that.  Done at [place].   Flu 2023 Shingles previously done PNA previously done Tetanus 2014 COVID-vaccine previously done Colonoscopy scheduled 2023 Breast cancer screening 2023 Bone density test 2018, ordered 2023 Advance directive-husband designated if patient were incapacitated.  PMH and SH reviewed.   Vital signs, Meds and allergies reviewed.  ROS: Per HPI unless specifically indicated in ROS section   GEN: nad, alert and oriented HEENT: mucous membranes moist NECK: supple w/o LA CV: rrr.  no murmur PULM: ctab, no inc wob ABD: soft, +bs EXT: no edema SKIN: no acute rash  Diabetic foot exam: Normal inspection No skin  breakdown No calluses  Normal DP pulses Normal sensation to light tough and monofilament Nails normal

## 2022-09-27 DIAGNOSIS — Z0000 Encounter for general adult medical examination without abnormal findings: Secondary | ICD-10-CM | POA: Insufficient documentation

## 2022-09-27 DIAGNOSIS — M25559 Pain in unspecified hip: Secondary | ICD-10-CM | POA: Insufficient documentation

## 2022-09-27 NOTE — Assessment & Plan Note (Signed)
Continue work on diet and exercise.  Continue metformin.  Recheck periodically.

## 2022-09-27 NOTE — Assessment & Plan Note (Signed)
Flu 2023 Shingles previously done PNA previously done Tetanus 2014 COVID-vaccine previously done Colonoscopy scheduled 2023 Breast cancer screening 2023 Bone density test 2018, ordered 2023 Advance directive-husband designated if patient were incapacitated.

## 2022-09-27 NOTE — Assessment & Plan Note (Signed)
Continue Crestor.  Continue work on diet and exercise.  Labs discussed with patient. 

## 2022-09-27 NOTE — Assessment & Plan Note (Signed)
Advance directive- husband designated if patient were incapacitated.  

## 2022-09-27 NOTE — Assessment & Plan Note (Signed)
Continue home losartan hydrochlorothiazide.  Continue work on diet and exercise.

## 2022-09-27 NOTE — Assessment & Plan Note (Signed)
Exacerbated by walking on soft sand.  She does better walking on compact sand but the trouble is getting [place] to the area where she can walk more easily.  I filled out a parking form for her so that she would be able to get closer to the area where she can walk at the beach

## 2022-09-27 NOTE — Assessment & Plan Note (Signed)
Continue work on diet and exercise.  Continue his omeprazole.

## 2022-09-27 NOTE — Assessment & Plan Note (Signed)
Followed by neurosurgery.  Reassuring recent imaging.  Discussed.

## 2022-10-20 ENCOUNTER — Inpatient Hospital Stay
Admission: RE | Admit: 2022-10-20 | Discharge: 2022-10-20 | Disposition: A | Discharge status: Home / Self Care | Payer: PPO | Source: Ambulatory Visit | Attending: Family Medicine | Admitting: Family Medicine

## 2022-10-20 ENCOUNTER — Ambulatory Visit (INDEPENDENT_AMBULATORY_CARE_PROVIDER_SITE_OTHER): Payer: PPO | Admitting: Nurse Practitioner

## 2022-10-20 ENCOUNTER — Other Ambulatory Visit: Payer: Self-pay | Admitting: Family Medicine

## 2022-10-20 ENCOUNTER — Encounter: Payer: Self-pay | Admitting: Nurse Practitioner

## 2022-10-20 ENCOUNTER — Ambulatory Visit (INDEPENDENT_AMBULATORY_CARE_PROVIDER_SITE_OTHER)
Admission: RE | Admit: 2022-10-20 | Discharge: 2022-10-20 | Disposition: A | Discharge status: Home / Self Care | Payer: PPO | Source: Ambulatory Visit | Attending: Nurse Practitioner | Admitting: Nurse Practitioner

## 2022-10-20 VITALS — BP 138/80 | HR 96 | Temp 98.6°F | Ht 61.0 in | Wt 199.0 lb

## 2022-10-20 DIAGNOSIS — R0689 Other abnormalities of breathing: Secondary | ICD-10-CM

## 2022-10-20 DIAGNOSIS — R0989 Other specified symptoms and signs involving the circulatory and respiratory systems: Secondary | ICD-10-CM | POA: Diagnosis not present

## 2022-10-20 DIAGNOSIS — J0110 Acute frontal sinusitis, unspecified: Secondary | ICD-10-CM | POA: Diagnosis not present

## 2022-10-20 DIAGNOSIS — R051 Acute cough: Secondary | ICD-10-CM | POA: Diagnosis not present

## 2022-10-20 MED ORDER — HYDROCODONE BIT-HOMATROP MBR 5-1.5 MG/5ML PO SOLN
5.0000 mL | Freq: Two times a day (BID) | ORAL | 0 refills | Status: AC | PRN
Start: 2022-10-20 — End: 2022-10-30

## 2022-10-20 MED ORDER — AMOXICILLIN-POT CLAVULANATE 875-125 MG PO TABS: 1.0000 | ORAL_TABLET | Freq: Two times a day (BID) | ORAL | 0 refills | Status: AC

## 2022-10-20 NOTE — Assessment & Plan Note (Signed)
Husband who was ill recently came and it was had to be treated with antibiotics.  Will start patient on Augmentin 875-125 mg twice daily for 7 days.  Follow-up if no improvement she can continue taking her antihistamine and fluticasone nasal spray as prescribed

## 2022-10-20 NOTE — Progress Notes (Signed)
Acute Office Visit  Subjective:     Patient ID: Anita Cooper, female    DOB: 1950/12/16, 71 y.o.   MRN: [id number]  Chief Complaint  Patient presents with   Respiratory Illness    Started about 5 days ago with a cough, ear pain/fullness, headache at times, sinus pressure. Did not do a home Covid test.     HPI Patient is in today for Sick symptoms   Symptoms started approx 5 days ago States that her husband is sick States they kept the grandbabies and they were sick  No covid test at home Covid vaccines up to date Flu vaccine up to date States that she has been using cough medicaiton from the past that did not work. States that her husband had some cough medication that she tried and it did not help either Promethazine cough syrup did not help bromeaphine cough syrup did not help Review of Systems  Constitutional:  Negative for chills and fever.       Appetite decreased Plenty of fluid intake   HENT:  Positive for ear pain, sinus pain and sore throat (scrtahcy). Negative for ear discharge.   Respiratory:  Positive for cough and sputum production (green). Negative for shortness of breath and stridor.   Cardiovascular:  Negative for chest pain.  Gastrointestinal:  Negative for abdominal pain, nausea and vomiting.  Musculoskeletal:  Negative for joint pain and myalgias.  Neurological:  Positive for headaches.        Objective:    BP 138/80 (BP Location: Right Arm, Patient Position: Sitting, Cuff Size: Large)   Pulse 96   Temp 98.6 F (37 C)   Ht '5\' 1"'$  (1.549 m)   Wt 199 lb (90.3 kg)   SpO2 96%   BMI 37.60 kg/m    Physical Exam Vitals and nursing note reviewed.  Constitutional:      Appearance: Normal appearance. She is obese.  HENT:     Right Ear: Tympanic membrane, ear canal and external ear normal.     Left Ear: Tympanic membrane, ear canal and external ear normal.     Nose:     Right Sinus: Frontal sinus tenderness present. No maxillary sinus  tenderness.     Left Sinus: Frontal sinus tenderness present. No maxillary sinus tenderness.     Mouth/Throat:     Mouth: Mucous membranes are moist.     Pharynx: Oropharynx is clear.  Cardiovascular:     Rate and Rhythm: Normal rate and regular rhythm.     Heart sounds: Normal heart sounds.  Pulmonary:     Effort: Pulmonary effort is normal.     Breath sounds: Wheezing and rhonchi present.     Comments: RLL severely decrease breath sounds Lymphadenopathy:     Cervical: No cervical adenopathy.  Neurological:     Mental Status: She is alert.     No results found for any visits on 10/20/22.      Assessment & Plan:   Problem List Items Addressed This Visit       Respiratory   Acute non-recurrent frontal sinusitis - Primary    Husband who was ill recently came and it was had to be treated with antibiotics.  Will start patient on Augmentin 875-125 mg twice daily for 7 days.  Follow-up if no improvement she can continue taking her antihistamine and fluticasone nasal spray as prescribed      Relevant Medications   amoxicillin-clavulanate (AUGMENTIN) 875-125 MG tablet   HYDROcodone bit-homatropine (HYCODAN)  5-1.5 MG/5ML syrup     Other   Abnormal breath sounds    Wheezing rhonchi appreciated in right upper lobe and severely decreased breath sounds right lower lobe.  Pending chest x-ray      Relevant Orders   DG Chest 2 View   Acute cough    Hycodan cough syrup 5 mL twice daily as needed.  Sedation precautions reviewed      Relevant Medications   HYDROcodone bit-homatropine (HYCODAN) 5-1.5 MG/5ML syrup    Meds ordered this encounter  Medications   amoxicillin-clavulanate (AUGMENTIN) 875-125 MG tablet    Sig: Take 1 tablet by mouth 2 (two) times daily for 7 days.    Dispense:  14 tablet    Refill:  0    Order Specific Question:   Supervising Provider    Answer:   Glori Bickers, MARNE A [[id number]]   HYDROcodone bit-homatropine (HYCODAN) 5-1.5 MG/5ML syrup    Sig: Take 5 mLs by  mouth 2 (two) times daily as needed for up to 10 days for cough.    Dispense:  100 mL    Refill:  0    Order Specific Question:   Supervising Provider    Answer:   Loura Pardon A [[id number]]    Return if symptoms worsen or fail to improve.  Romilda Garret, NP

## 2022-10-20 NOTE — Assessment & Plan Note (Signed)
Hycodan cough syrup 5 mL twice daily as needed.  Sedation precautions reviewed

## 2022-10-20 NOTE — Patient Instructions (Signed)
Nice to see you today I will be in touch with the xray once I have the result Follow up if no improvement

## 2022-10-20 NOTE — Assessment & Plan Note (Signed)
Wheezing rhonchi appreciated in right upper lobe and severely decreased breath sounds right lower lobe.  Pending chest x-ray

## 2022-10-23 ENCOUNTER — Other Ambulatory Visit: Payer: Self-pay | Admitting: Nurse Practitioner

## 2022-10-23 ENCOUNTER — Ambulatory Visit: Payer: PPO | Admitting: Family Medicine

## 2022-10-23 DIAGNOSIS — J189 Pneumonia, unspecified organism: Secondary | ICD-10-CM

## 2022-11-02 DIAGNOSIS — C50911 Malignant neoplasm of unspecified site of right female breast: Secondary | ICD-10-CM | POA: Diagnosis not present

## 2022-11-02 DIAGNOSIS — Z853 Personal history of malignant neoplasm of breast: Secondary | ICD-10-CM | POA: Diagnosis not present

## 2022-11-05 ENCOUNTER — Other Ambulatory Visit: Payer: Self-pay | Admitting: Family Medicine

## 2022-11-27 ENCOUNTER — Encounter: Payer: Self-pay | Admitting: Family Medicine

## 2022-11-27 ENCOUNTER — Ambulatory Visit (INDEPENDENT_AMBULATORY_CARE_PROVIDER_SITE_OTHER)
Admission: RE | Admit: 2022-11-27 | Discharge: 2022-11-27 | Disposition: A | Discharge status: Home / Self Care | Payer: Medicare Other | Source: Ambulatory Visit | Attending: Nurse Practitioner | Admitting: Nurse Practitioner

## 2022-11-27 ENCOUNTER — Telehealth: Payer: Self-pay | Admitting: Family Medicine

## 2022-11-27 DIAGNOSIS — J189 Pneumonia, unspecified organism: Secondary | ICD-10-CM

## 2022-11-27 NOTE — Telephone Encounter (Signed)
Pt came in office to change pharmacy to [facility] Anita Cooper, [place] - [place]  . No medication needed at this time

## 2022-11-27 NOTE — Telephone Encounter (Signed)
Pharmacy has been updated.

## 2022-11-28 MED ORDER — ONETOUCH ULTRA VI STRP: ORAL_STRIP | 12 refills | Status: DC

## 2022-11-28 MED ORDER — OLMESARTAN MEDOXOMIL-HCTZ 40-25 MG PO TABS: ORAL_TABLET | ORAL | 3 refills | Status: DC

## 2022-11-28 MED ORDER — FEXOFENADINE HCL 180 MG PO TABS: 180.0000 mg | ORAL_TABLET | Freq: Every day | ORAL | 3 refills | Status: DC

## 2022-11-28 MED ORDER — ROSUVASTATIN CALCIUM 5 MG PO TABS: 5.0000 mg | ORAL_TABLET | Freq: Every day | ORAL | 3 refills | Status: DC

## 2022-11-28 MED ORDER — METFORMIN HCL 500 MG PO TABS: 1000.0000 mg | ORAL_TABLET | Freq: Two times a day (BID) | ORAL | 2 refills | Status: DC

## 2022-11-28 MED ORDER — ESOMEPRAZOLE MAGNESIUM 40 MG PO CPDR: DELAYED_RELEASE_CAPSULE | ORAL | 3 refills | Status: DC

## 2022-11-28 MED ORDER — FLUTICASONE PROPIONATE 50 MCG/ACT NA SUSP: 1.0000 | Freq: Every day | NASAL | 3 refills | Status: DC

## 2022-12-02 DIAGNOSIS — N390 Urinary tract infection, site not specified: Secondary | ICD-10-CM | POA: Diagnosis not present

## 2022-12-04 DIAGNOSIS — N390 Urinary tract infection, site not specified: Secondary | ICD-10-CM | POA: Diagnosis not present

## 2023-02-05 DIAGNOSIS — H524 Presbyopia: Secondary | ICD-10-CM | POA: Diagnosis not present

## 2023-02-05 DIAGNOSIS — H2513 Age-related nuclear cataract, bilateral: Secondary | ICD-10-CM | POA: Diagnosis not present

## 2023-02-05 DIAGNOSIS — E119 Type 2 diabetes mellitus without complications: Secondary | ICD-10-CM | POA: Diagnosis not present

## 2023-02-05 LAB — HM DIABETES EYE EXAM

## 2023-02-20 DIAGNOSIS — D225 Melanocytic nevi of trunk: Secondary | ICD-10-CM | POA: Diagnosis not present

## 2023-02-20 DIAGNOSIS — L570 Actinic keratosis: Secondary | ICD-10-CM | POA: Diagnosis not present

## 2023-02-20 DIAGNOSIS — L821 Other seborrheic keratosis: Secondary | ICD-10-CM | POA: Diagnosis not present

## 2023-02-20 DIAGNOSIS — C44529 Squamous cell carcinoma of skin of other part of trunk: Secondary | ICD-10-CM | POA: Diagnosis not present

## 2023-02-20 DIAGNOSIS — L814 Other melanin hyperpigmentation: Secondary | ICD-10-CM | POA: Diagnosis not present

## 2023-02-20 DIAGNOSIS — I788 Other diseases of capillaries: Secondary | ICD-10-CM | POA: Diagnosis not present

## 2023-02-20 DIAGNOSIS — K080 Exfoliation of teeth due to systemic causes: Secondary | ICD-10-CM | POA: Diagnosis not present

## 2023-02-21 DIAGNOSIS — Z01419 Encounter for gynecological examination (general) (routine) without abnormal findings: Secondary | ICD-10-CM | POA: Diagnosis not present

## 2023-02-21 DIAGNOSIS — Z6838 Body mass index (BMI) 38.0-38.9, adult: Secondary | ICD-10-CM | POA: Diagnosis not present

## 2023-03-27 ENCOUNTER — Encounter: Payer: Self-pay | Admitting: Family Medicine

## 2023-03-27 ENCOUNTER — Ambulatory Visit (INDEPENDENT_AMBULATORY_CARE_PROVIDER_SITE_OTHER): Payer: Medicare Other | Admitting: Family Medicine

## 2023-03-27 VITALS — BP 126/70 | HR 96 | Temp 97.9°F | Ht 61.0 in | Wt 201.0 lb

## 2023-03-27 DIAGNOSIS — E119 Type 2 diabetes mellitus without complications: Secondary | ICD-10-CM | POA: Diagnosis not present

## 2023-03-27 DIAGNOSIS — Z7984 Long term (current) use of oral hypoglycemic drugs: Secondary | ICD-10-CM

## 2023-03-27 DIAGNOSIS — G4700 Insomnia, unspecified: Secondary | ICD-10-CM

## 2023-03-27 LAB — MICROALBUMIN / CREATININE URINE RATIO
Creatinine,U: 86 mg/dL
Microalb Creat Ratio: 0.9 mg/g (ref 0.0–30.0)
Microalb, Ur: 0.8 mg/dL (ref 0.0–1.9)

## 2023-03-27 LAB — POCT GLYCOSYLATED HEMOGLOBIN (HGB A1C): Hemoglobin A1C: 8.3 % — AB (ref 4.0–5.6)

## 2023-03-27 MED ORDER — EMPAGLIFLOZIN 10 MG PO TABS: 10.0000 mg | ORAL_TABLET | Freq: Every day | ORAL | 3 refills | Status: DC

## 2023-03-27 NOTE — Progress Notes (Unsigned)
Diabetes:  Using medications without difficulties: yes Hypoglycemic episodes: cautions d/w pt.   Hyperglycemic episodes: no Feet problems: no Blood Sugars averaging: not checked.   eye exam within last year: yes A1c 8.3. D/w pt about diet and exercise.    Insomnia d/w pt.  Some waking about 2 AM, then gets back to sleep.  Not every night but sporadic.  "I'm a night person."  D/w pt about options.  She can update me as needed.    Meds, vitals, and allergies reviewed.   ROS: Per HPI unless specifically indicated in ROS section   GEN: nad, alert and oriented HEENT: ncat NECK: supple w/o LA CV: rrr. PULM: ctab, no inc wob ABD: soft, +bs EXT: no edema SKIN: well perfused  Diabetic foot exam: Normal inspection No skin breakdown No calluses  Normal DP pulses Normal sensation to light touch and monofilament Nails normal

## 2023-03-27 NOTE — Patient Instructions (Signed)
Add on jardiance and if not tolerated then let me know.   Recheck in about 6 months at a yearly visit.  If your sugar isn't improving then let me know.   Keep working on diet and exercise.  Take care.  Glad to see you.

## 2023-03-28 DIAGNOSIS — G4700 Insomnia, unspecified: Secondary | ICD-10-CM | POA: Insufficient documentation

## 2023-03-28 NOTE — Assessment & Plan Note (Signed)
Continue metformin.  See notes on labs. Add on jardiance and if not tolerated then let me know.   Recheck in about 6 months at a yearly visit.  If her sugar isn't improving then let me know in the meantime. Keep working on diet and exercise.

## 2023-03-28 NOTE — Assessment & Plan Note (Signed)
D/w pt about options.  She can update me as needed.

## 2023-04-12 DIAGNOSIS — H52203 Unspecified astigmatism, bilateral: Secondary | ICD-10-CM | POA: Diagnosis not present

## 2023-07-10 DIAGNOSIS — K080 Exfoliation of teeth due to systemic causes: Secondary | ICD-10-CM | POA: Diagnosis not present

## 2023-07-18 DIAGNOSIS — K080 Exfoliation of teeth due to systemic causes: Secondary | ICD-10-CM | POA: Diagnosis not present

## 2023-08-16 DIAGNOSIS — H52223 Regular astigmatism, bilateral: Secondary | ICD-10-CM | POA: Diagnosis not present

## 2023-08-16 DIAGNOSIS — H35341 Macular cyst, hole, or pseudohole, right eye: Secondary | ICD-10-CM | POA: Diagnosis not present

## 2023-08-16 DIAGNOSIS — H25813 Combined forms of age-related cataract, bilateral: Secondary | ICD-10-CM | POA: Diagnosis not present

## 2023-08-20 ENCOUNTER — Other Ambulatory Visit: Payer: Self-pay | Admitting: Neurosurgery

## 2023-08-20 DIAGNOSIS — D3614 Benign neoplasm of peripheral nerves and autonomic nervous system of thorax: Secondary | ICD-10-CM

## 2023-08-22 ENCOUNTER — Encounter: Payer: Self-pay | Admitting: Family Medicine

## 2023-08-22 DIAGNOSIS — K080 Exfoliation of teeth due to systemic causes: Secondary | ICD-10-CM | POA: Diagnosis not present

## 2023-09-04 ENCOUNTER — Ambulatory Visit
Admission: RE | Admit: 2023-09-04 | Discharge: 2023-09-04 | Disposition: A | Discharge status: Home / Self Care | Payer: Medicare Other | Source: Ambulatory Visit | Attending: Neurosurgery | Admitting: Neurosurgery

## 2023-09-04 DIAGNOSIS — Z853 Personal history of malignant neoplasm of breast: Secondary | ICD-10-CM | POA: Diagnosis not present

## 2023-09-04 DIAGNOSIS — D3614 Benign neoplasm of peripheral nerves and autonomic nervous system of thorax: Secondary | ICD-10-CM | POA: Insufficient documentation

## 2023-09-04 DIAGNOSIS — M5134 Other intervertebral disc degeneration, thoracic region: Secondary | ICD-10-CM | POA: Diagnosis not present

## 2023-09-04 MED ORDER — GADOBUTROL 1 MMOL/ML IV SOLN
7.5000 mL | Freq: Once | INTRAVENOUS | Status: AC | PRN
  Administered 2023-09-04: 7.5 mL via INTRAVENOUS

## 2023-09-05 ENCOUNTER — Ambulatory Visit: Payer: Medicare Other

## 2023-09-12 DIAGNOSIS — Z1231 Encounter for screening mammogram for malignant neoplasm of breast: Secondary | ICD-10-CM | POA: Diagnosis not present

## 2023-09-12 LAB — HM MAMMOGRAPHY

## 2023-09-13 ENCOUNTER — Encounter: Payer: Self-pay | Admitting: Family Medicine

## 2023-09-29 ENCOUNTER — Other Ambulatory Visit: Payer: Self-pay | Admitting: Family Medicine

## 2023-09-30 ENCOUNTER — Other Ambulatory Visit: Payer: Self-pay | Admitting: Family Medicine

## 2023-09-30 DIAGNOSIS — E119 Type 2 diabetes mellitus without complications: Secondary | ICD-10-CM

## 2023-10-01 ENCOUNTER — Other Ambulatory Visit (INDEPENDENT_AMBULATORY_CARE_PROVIDER_SITE_OTHER): Payer: Medicare Other

## 2023-10-01 DIAGNOSIS — Z6836 Body mass index (BMI) 36.0-36.9, adult: Secondary | ICD-10-CM | POA: Diagnosis not present

## 2023-10-01 DIAGNOSIS — E119 Type 2 diabetes mellitus without complications: Secondary | ICD-10-CM | POA: Diagnosis not present

## 2023-10-01 DIAGNOSIS — D3614 Benign neoplasm of peripheral nerves and autonomic nervous system of thorax: Secondary | ICD-10-CM | POA: Diagnosis not present

## 2023-10-01 LAB — VITAMIN B12: Vitamin B-12: 219 pg/mL (ref 211–911)

## 2023-10-01 LAB — COMPREHENSIVE METABOLIC PANEL
ALT: 34 U/L (ref 0–35)
AST: 38 U/L — ABNORMAL HIGH (ref 0–37)
Albumin: 4.2 g/dL (ref 3.5–5.2)
Alkaline Phosphatase: 38 U/L — ABNORMAL LOW (ref 39–117)
BUN: 16 mg/dL (ref 6–23)
CO2: 31 meq/L (ref 19–32)
Calcium: 10.2 mg/dL (ref 8.4–10.5)
Chloride: 96 meq/L (ref 96–112)
Creatinine, Ser: 0.92 mg/dL (ref 0.40–1.20)
GFR: 62.69 mL/min (ref >60.00)
Glucose, Bld: 147 mg/dL — ABNORMAL HIGH (ref 70–99)
Potassium: 3.9 meq/L (ref 3.5–5.1)
Sodium: 141 meq/L (ref 135–145)
Total Bilirubin: 0.6 mg/dL (ref 0.2–1.2)
Total Protein: 6.8 g/dL (ref 6.0–8.3)

## 2023-10-01 LAB — LIPID PANEL
Cholesterol: 117 mg/dL (ref 0–200)
HDL: 45.9 mg/dL (ref >39.00)
LDL Cholesterol: 40 mg/dL (ref 0–99)
NonHDL: 71.18
Total CHOL/HDL Ratio: 3
Triglycerides: 154 mg/dL — ABNORMAL HIGH (ref 0.0–149.0)
VLDL: 30.8 mg/dL (ref 0.0–40.0)

## 2023-10-01 LAB — HEMOGLOBIN A1C: Hgb A1c MFr Bld: 8.1 % — ABNORMAL HIGH (ref 4.6–6.5)

## 2023-10-05 ENCOUNTER — Ambulatory Visit (INDEPENDENT_AMBULATORY_CARE_PROVIDER_SITE_OTHER): Payer: Medicare Other | Admitting: Family Medicine

## 2023-10-05 ENCOUNTER — Encounter: Payer: Self-pay | Admitting: Family Medicine

## 2023-10-05 VITALS — BP 140/70 | HR 78 | Temp 98.2°F | Ht 60.0 in | Wt 191.4 lb

## 2023-10-05 DIAGNOSIS — K219 Gastro-esophageal reflux disease without esophagitis: Secondary | ICD-10-CM | POA: Diagnosis not present

## 2023-10-05 DIAGNOSIS — E7800 Pure hypercholesterolemia, unspecified: Secondary | ICD-10-CM

## 2023-10-05 DIAGNOSIS — I10 Essential (primary) hypertension: Secondary | ICD-10-CM | POA: Diagnosis not present

## 2023-10-05 DIAGNOSIS — R222 Localized swelling, mass and lump, trunk: Secondary | ICD-10-CM

## 2023-10-05 DIAGNOSIS — E119 Type 2 diabetes mellitus without complications: Secondary | ICD-10-CM | POA: Diagnosis not present

## 2023-10-05 DIAGNOSIS — I2510 Atherosclerotic heart disease of native coronary artery without angina pectoris: Secondary | ICD-10-CM

## 2023-10-05 DIAGNOSIS — Z0000 Encounter for general adult medical examination without abnormal findings: Secondary | ICD-10-CM

## 2023-10-05 DIAGNOSIS — Z7984 Long term (current) use of oral hypoglycemic drugs: Secondary | ICD-10-CM

## 2023-10-05 DIAGNOSIS — Z7189 Other specified counseling: Secondary | ICD-10-CM

## 2023-10-05 MED ORDER — VITAMIN B-12 1000 MCG PO TABS: 1000.0000 ug | ORAL_TABLET | Freq: Every day | ORAL | Status: DC

## 2023-10-05 MED ORDER — ROSUVASTATIN CALCIUM 5 MG PO TABS: 5.0000 mg | ORAL_TABLET | Freq: Every day | ORAL | 3 refills | Status: DC

## 2023-10-05 MED ORDER — OLMESARTAN MEDOXOMIL-HCTZ 40-25 MG PO TABS: ORAL_TABLET | ORAL | 3 refills | Status: DC

## 2023-10-05 MED ORDER — ESOMEPRAZOLE MAGNESIUM 40 MG PO CPDR: DELAYED_RELEASE_CAPSULE | ORAL | 3 refills | Status: DC

## 2023-10-05 MED ORDER — METFORMIN HCL 1000 MG PO TABS: 1000.0000 mg | ORAL_TABLET | Freq: Two times a day (BID) | ORAL | 3 refills | Status: DC

## 2023-10-05 MED ORDER — EMPAGLIFLOZIN 25 MG PO TABS: 25.0000 mg | ORAL_TABLET | Freq: Every day | ORAL | 3 refills | Status: DC

## 2023-10-05 NOTE — Patient Instructions (Addendum)
Tetanus shot when possible at pharmacy.  Take care.  Glad to see you. Add on B12 daily.   Recheck A1c in about 3 months at a visit.   Refer to cardiology.  Change metformin to higher dose pills.  Increase jardiance to 25mg .

## 2023-10-05 NOTE — Progress Notes (Unsigned)
Flu 2024 Shingles previously done PNA previously done Tetanus 2014 COVID-vaccine previously done Colonoscopy 2023 Breast cancer screening 2024 Bone density WNL 2023 Advance directive-husband designated if patient were incapacitated.  Diabetes:  Using medications without difficulties: yes Hypoglycemic episodes:no Hyperglycemic episodes:no Feet problems: no Blood Sugars averaging: not checked often eye exam within last year: yes Labs d/w pt.    Hypertension:    Using medication without problems or lightheadedness: yes Occ L sided chest discomfort that could be related to either GERD vs L shoulder pain.  Not exertional.   Edema:no Short of breath:no Labs d/w pt.    GERD generally improved on PPI, d/w pt.    Elevated Cholesterol: Using medications without problems: yes Muscle aches: no Diet compliance: d/w pt.   Exercise: d/w pt.   Labs d/w pt.   D/w pt about taking B12 given her results.    She had neurosurgery eval. She'll have f/u later on.   1. No change in the left posterior mediastinal paraspinal mass arising from the intervertebral foramen on the left measuring 3.1 x 2.5 x 2.1 cm, consistent with benign neurofibroma or schwannoma. No intraspinal extension. 2. No evidence of metastatic disease. 3. Minimal non-compressive changes of disc degeneration and bulging from T3-4 through T10-11.  She has ortho f/u pending re: R trigger finger.  She had prev injection   PMH and SH reviewed.   Vital signs, Meds and allergies reviewed.  ROS: Per HPI unless specifically indicated in ROS section   GEN: nad, alert and oriented HEENT: mucous membranes moist NECK: supple w/o LA CV: rrr.  no murmur PULM: ctab, no inc wob ABD: soft, +bs EXT: no edema SKIN: no acute rash She has discomfort with L shoulder with ext rotation.    Diabetic foot exam: Normal inspection No skin breakdown No calluses  Normal DP pulses Normal sensation to light tough and monofilament Nails  normal

## 2023-10-07 NOTE — Assessment & Plan Note (Signed)
Continue Crestor 

## 2023-10-07 NOTE — Assessment & Plan Note (Signed)
Add on B12 daily.   Recheck A1c in about 3 months at a visit.   Continue meds: 1000 mg twice a day, but with higher strength pills.  Increase jardiance to 25mg .  Continue work on diet and exercise.

## 2023-10-07 NOTE — Assessment & Plan Note (Signed)
Advance directive- husband designated if patient were incapacitated.

## 2023-10-07 NOTE — Assessment & Plan Note (Signed)
She does not have exertional pain but we talked about having cardiology follow-up given her previous findings.  She agrees.

## 2023-10-07 NOTE — Assessment & Plan Note (Deleted)
Statin intolerant 

## 2023-10-07 NOTE — Assessment & Plan Note (Signed)
Continue Nexium

## 2023-10-07 NOTE — Assessment & Plan Note (Signed)
With reassuring imaging per neurosurgery.  She is going to continue to with neurosurgery follow-up.  I will defer.  She agrees.

## 2023-10-07 NOTE — Assessment & Plan Note (Signed)
Flu 2024 Shingles previously done PNA previously done Tetanus 2014 COVID-vaccine previously done Colonoscopy 2023 Breast cancer screening 2024 Bone density WNL 2023 Advance directive-husband designated if patient were incapacitated.

## 2023-10-07 NOTE — Assessment & Plan Note (Signed)
Continue olmesartan hydrochlorothiazide.  Continue work on diet and exercise.  Labs discussed with patient.

## 2023-10-08 ENCOUNTER — Encounter: Payer: Medicare Other | Admitting: Family Medicine

## 2023-12-10 DIAGNOSIS — H02834 Dermatochalasis of left upper eyelid: Secondary | ICD-10-CM | POA: Diagnosis not present

## 2023-12-10 DIAGNOSIS — H2513 Age-related nuclear cataract, bilateral: Secondary | ICD-10-CM | POA: Diagnosis not present

## 2023-12-10 DIAGNOSIS — H02831 Dermatochalasis of right upper eyelid: Secondary | ICD-10-CM | POA: Diagnosis not present

## 2023-12-10 DIAGNOSIS — H0289 Other specified disorders of eyelid: Secondary | ICD-10-CM | POA: Diagnosis not present

## 2024-01-03 DIAGNOSIS — H0289 Other specified disorders of eyelid: Secondary | ICD-10-CM | POA: Diagnosis not present

## 2024-01-03 DIAGNOSIS — H2513 Age-related nuclear cataract, bilateral: Secondary | ICD-10-CM | POA: Diagnosis not present

## 2024-01-07 ENCOUNTER — Ambulatory Visit (INDEPENDENT_AMBULATORY_CARE_PROVIDER_SITE_OTHER): Payer: Medicare Other | Admitting: Family Medicine

## 2024-01-07 VITALS — BP 130/60 | HR 76 | Temp 97.9°F | Wt 192.4 lb

## 2024-01-07 DIAGNOSIS — Z7984 Long term (current) use of oral hypoglycemic drugs: Secondary | ICD-10-CM | POA: Diagnosis not present

## 2024-01-07 DIAGNOSIS — E119 Type 2 diabetes mellitus without complications: Secondary | ICD-10-CM

## 2024-01-07 LAB — POCT GLYCOSYLATED HEMOGLOBIN (HGB A1C)
HbA1c POC (<> result, manual entry): 7.2 % (ref 4.0–5.6)
HbA1c, POC (controlled diabetic range): 7.2 % — AB (ref 0.0–7.0)
HbA1c, POC (prediabetic range): 72 % — AB (ref 5.7–6.4)
Hemoglobin A1C: 7.2 % — AB (ref 4.0–5.6)

## 2024-01-07 NOTE — Progress Notes (Unsigned)
 Diabetes:  Using medications without difficulties:yes, metformin and jardiance.  Hypoglycemic episodes: no Hyperglycemic episodes:no Feet problems: no Blood Sugars averaging: 125-150 eye exam within last year: yes A1c 7.2, improved, d/w pt at OV.  She is working on exercise.  D/w pt about diet.   Has cardiology eval pending. Exercising w/o CP.    Meds, vitals, and allergies reviewed.  ROS: Per HPI unless specifically indicated in ROS section   GEN: nad, alert and oriented HEENT: ncat NECK: supple w/o LA CV: rrr. PULM: ctab, no inc wob ABD: soft, +bs EXT: no edema SKIN: well perfused.

## 2024-01-07 NOTE — Patient Instructions (Addendum)
 Ask the pharmacy if they can get Farxiga easier than jardiance.   If you need a new rx, then let me know.   Thanks for your effort.   Recheck in about 3 months with A1c at the visit.  Take care.  Glad to see you.

## 2024-01-08 IMAGING — MR MR THORACIC SPINE WO/W CM
6 of 9 series · 28 of 48 positions shown · IV contrast (gadavist)
Comparison: 09/26/2021

CLINICAL DATA: Nerve sheath tumor, follow-up, history of breast
cancer

EXAM:
MRI THORACIC WITHOUT AND WITH CONTRAST
TECHNIQUE: Multiplanar and multiecho pulse sequences of the thoracic spine were
obtained without and with intravenous contrast.
CONTRAST:  9mL GADAVIST GADOBUTROL 1 MMOL/ML IV SOLN

[Series 16: T1 · sagittal · 5.0mm · 1.41mm/px · 1 of 9 slices shown (1 of 3)]
[im 1/9]
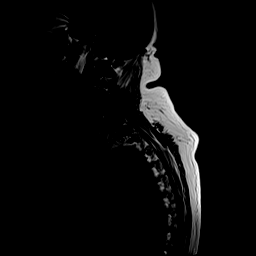

[Series 17: T2 · sagittal · 3.0mm · 1.06mm/px · 3 of 21 slices shown (1 of 2)]
[im 1/21]
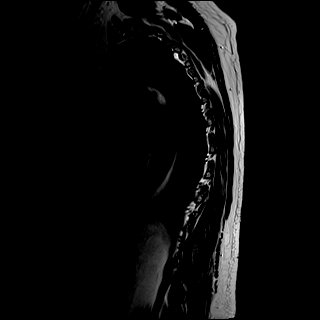
[im 11/21]
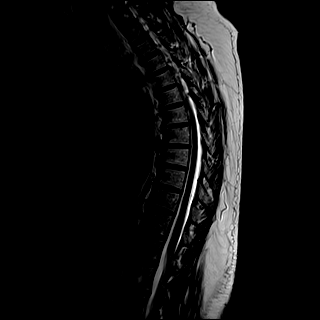
[im 21/21]
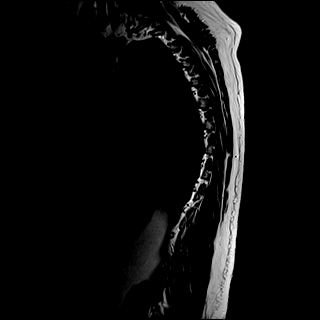

[Series 18: T1 · sagittal · 3.0mm · 1.06mm/px · 4 of 21 slices shown (2 of 3)]
[im 1/21]
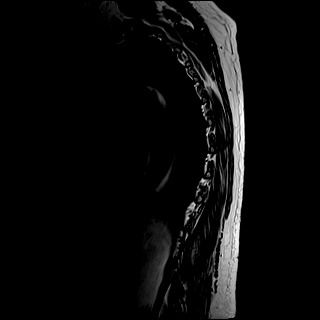
[im 7/21]
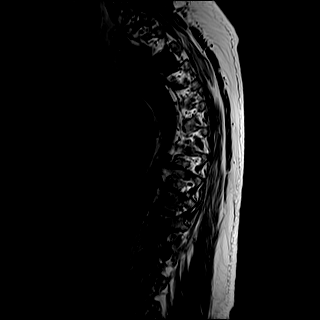
[im 14/21]
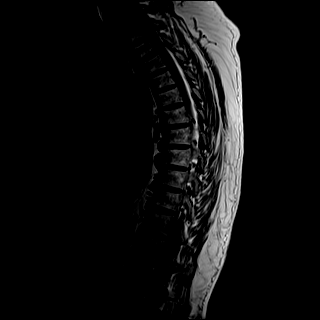
[im 21/21]
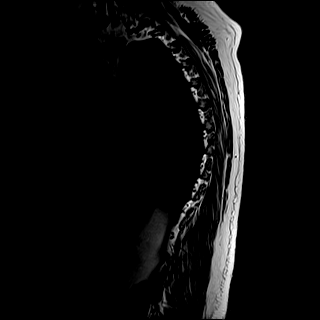

[Series 20: T2 · axial · 4.0mm · 0.59mm/px · z∈[-234,-51]mm · 8 of 39 slices shown (2 of 2)]
[im 1/39]
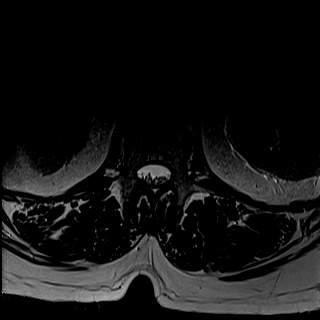
[im 6/39]
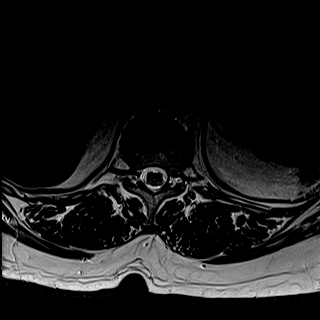
[im 11/39]
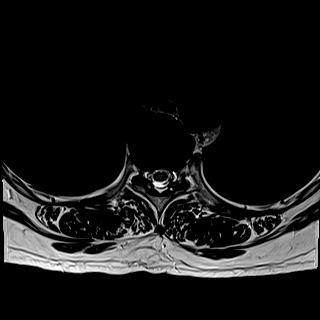
[im 17/39]
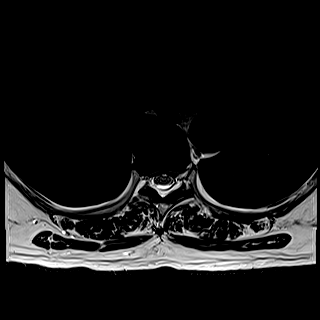
[im 22/39]
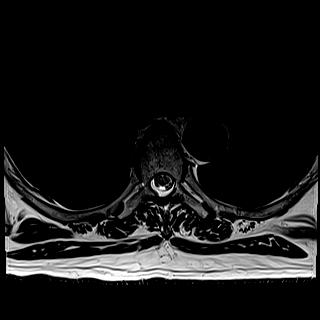
[im 28/39]
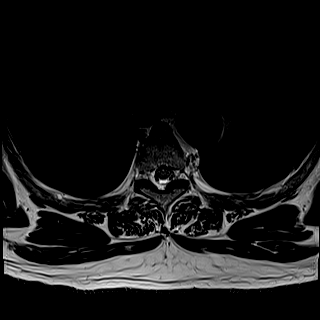
[im 33/39]
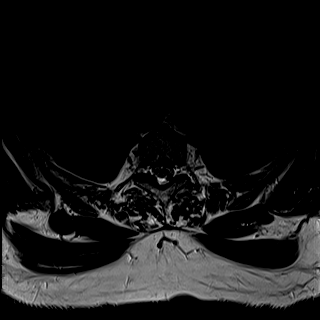
[im 39/39]
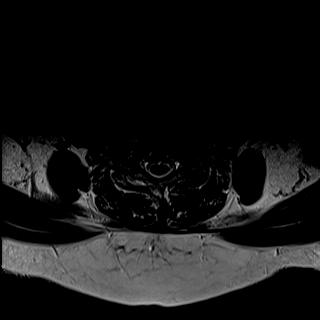

[Series 22: T1 · axial · non-contrast · 4.0mm · 0.31mm/px · z∈[-234,-51]mm · 8 of 39 slices shown (3 of 3)]
[im 1/39]
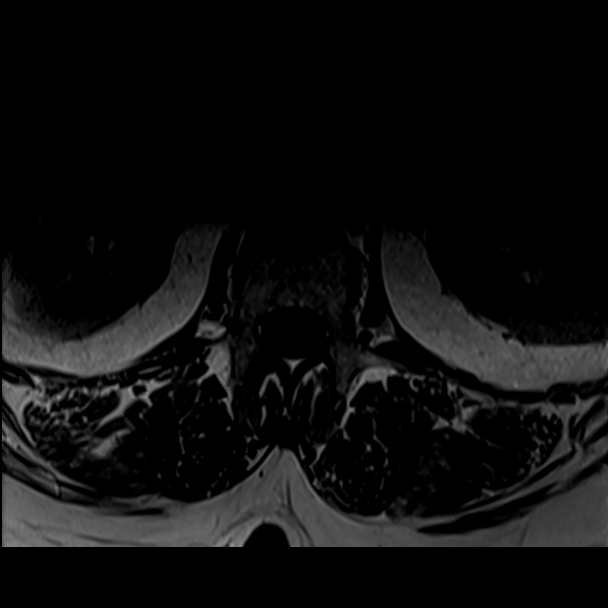
[im 6/39]
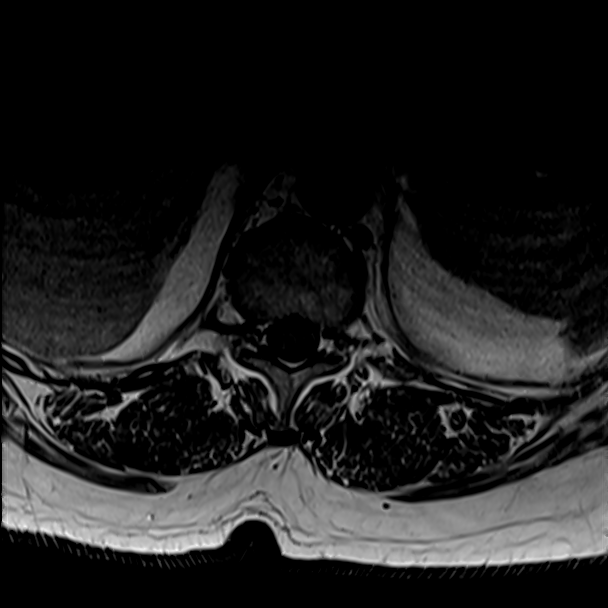
[im 11/39]
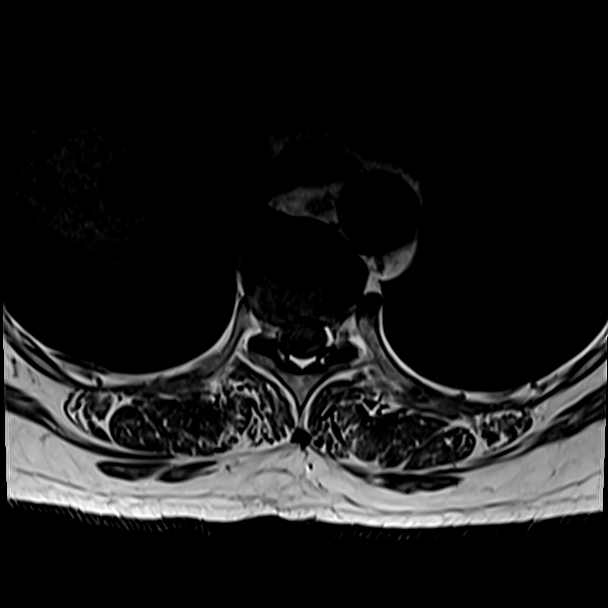
[im 17/39]
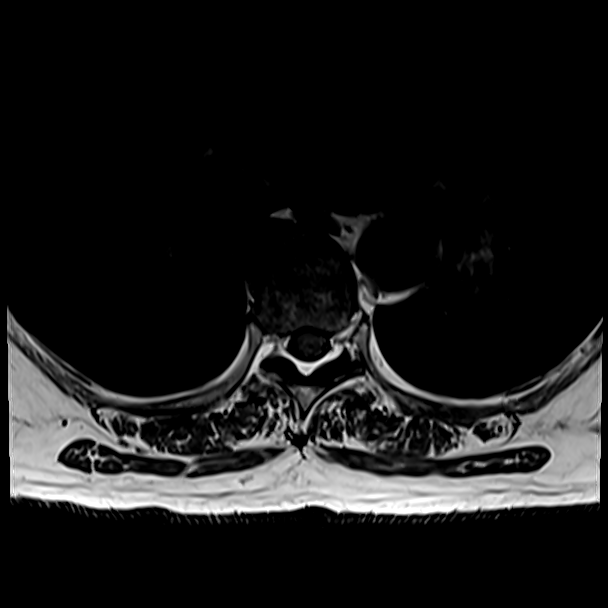
[im 22/39]
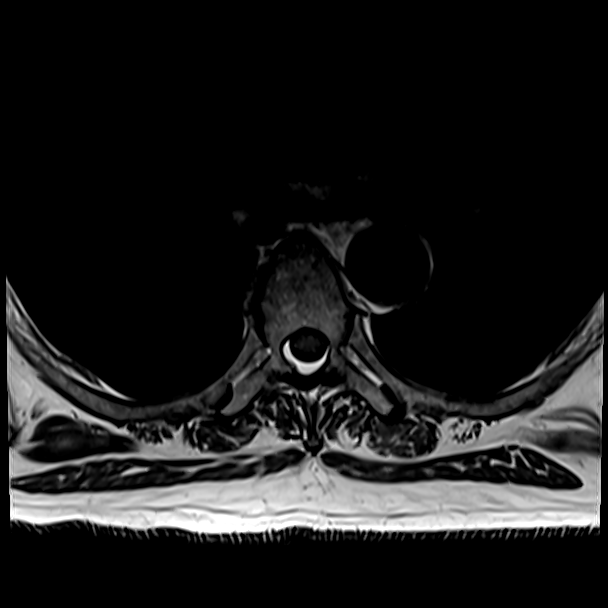
[im 28/39]
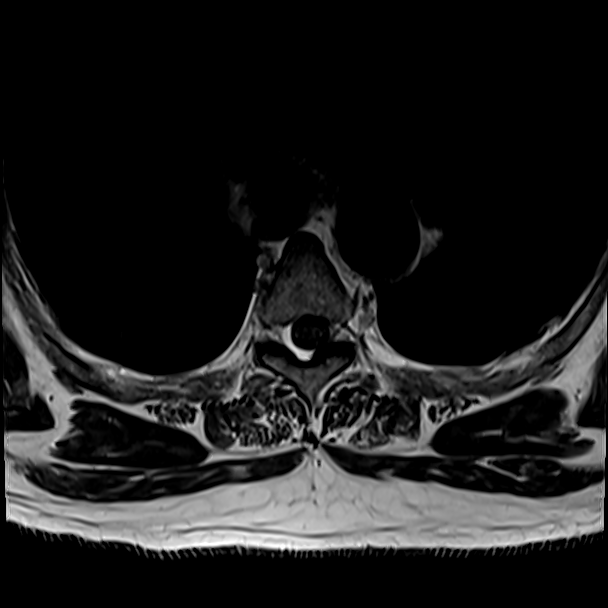
[im 33/39]
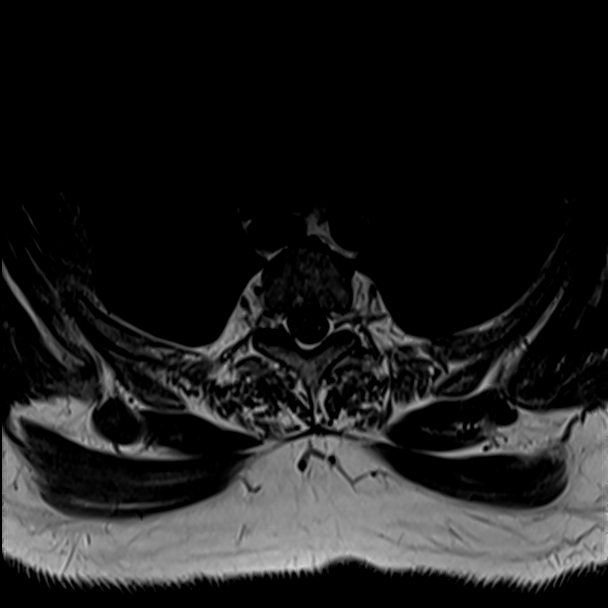
[im 39/39]
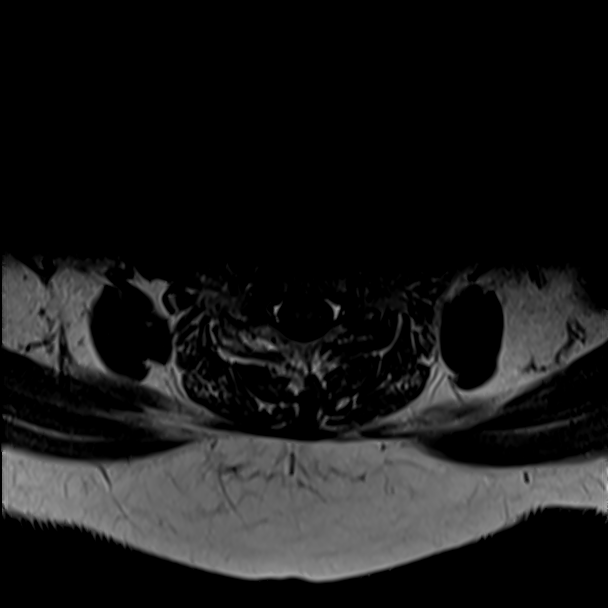

[Series 24: T1 fat-sat post-contrast · sagittal · 3.0mm · 1.06mm/px · 4 of 21 slices shown]
[im 1/21]
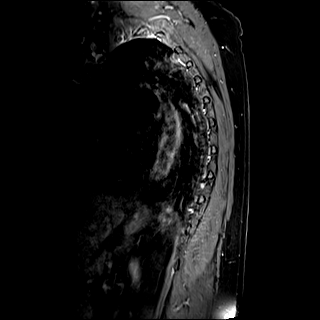
[im 7/21]
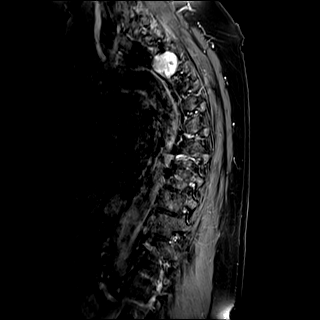
[im 14/21]
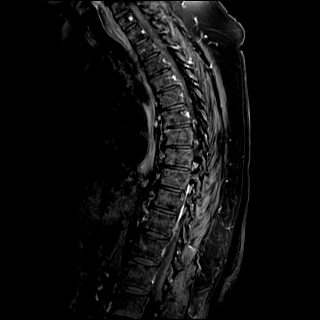
[im 21/21]
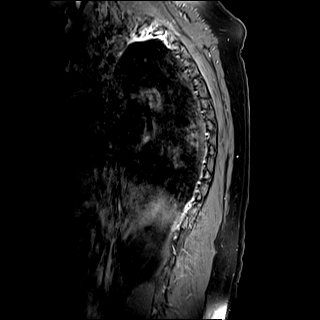

[28 of 48 positions shown; findings below may reference images not displayed]

FINDINGS: Alignment: Mild exaggeration of the normal thoracic kyphosis.
Minimal S shaped curvature of the thoracolumbar spine. No listhesis.

Vertebrae: No acute fracture or suspicious osseous lesion. No
abnormal osseous enhancement.

Cord:  Normal signal and morphology.

Paraspinal and other soft tissues: Refined paraspinal mass extending
into or from the left T3-T4 neural foramen, which measures 3.1 x
x 2.5 cm (AP x TR x CC) (series 23, image 10 and series 24, image
16), previously 3.3 x 2.3 x 2.5 cm when remeasured similarly. This
lesion is predominantly T2 hypointense with central T2
hyperintensity, possibly cystic or necrotic, and demonstrates
enhancement. Otherwise normal paraspinal soft tissues.

Disc levels:

No significant spinal canal stenosis or neural foraminal narrowing.
The left T3-T4 neural foramen is obstructed by the aforementioned
paraspinous mass.
IMPRESSION: Unchanged T3-T4 paraspinal mass extending into or from the left
T3-T4 neural foramen, favored to represent a nerve sheath tumor with
metastatic lesion felt to be less likely given stability.

## 2024-01-09 NOTE — Assessment & Plan Note (Signed)
 A1c 7.2, improved, d/w pt at OV.  She is working on exercise.  D/w pt about diet.  D/w pt about med availability.  She can ask the pharmacy if they can get [place] easier than jardiance.   If needing a new rx, then she can let me know.  Recheck in about 3 months with A1c at the visit.

## 2024-01-18 ENCOUNTER — Other Ambulatory Visit: Payer: Self-pay

## 2024-01-18 ENCOUNTER — Encounter: Payer: Self-pay | Admitting: Family Medicine

## 2024-01-18 MED ORDER — ONETOUCH ULTRASOFT LANCETS MISC: 12 refills | Status: DC

## 2024-01-18 MED ORDER — ONETOUCH ULTRA VI STRP: ORAL_STRIP | 12 refills | Status: DC

## 2024-01-22 ENCOUNTER — Other Ambulatory Visit: Payer: Self-pay

## 2024-01-22 MED ORDER — ONETOUCH ULTRA VI STRP: ORAL_STRIP | 12 refills | Status: DC

## 2024-01-22 MED ORDER — ONETOUCH ULTRASOFT LANCETS MISC: 12 refills | Status: DC

## 2024-01-22 NOTE — Telephone Encounter (Signed)
 Resent prescription

## 2024-01-22 NOTE — Telephone Encounter (Unsigned)
 Copied from CRM [id number]. Topic: Clinical - Prescription Issue >> Jan 22, 2024  1:08 PM Jon Gills C wrote: Reason for CRM: [facility] called stated the instructions on the prescription isnt clear and ask if they can resend them  Lancets (ONETOUCH ULTRASOFT) lancets glucose blood (ONETOUCH ULTRA) test strip

## 2024-01-28 ENCOUNTER — Ambulatory Visit: Payer: Medicare Other | Attending: Cardiology | Admitting: Cardiology

## 2024-01-28 VITALS — BP 128/64 | HR 84 | Resp 16 | Ht 60.0 in | Wt 193.8 lb

## 2024-01-28 DIAGNOSIS — I2584 Coronary atherosclerosis due to calcified coronary lesion: Secondary | ICD-10-CM | POA: Diagnosis not present

## 2024-01-28 DIAGNOSIS — I2510 Atherosclerotic heart disease of native coronary artery without angina pectoris: Secondary | ICD-10-CM | POA: Diagnosis not present

## 2024-01-28 DIAGNOSIS — E119 Type 2 diabetes mellitus without complications: Secondary | ICD-10-CM | POA: Diagnosis not present

## 2024-01-28 DIAGNOSIS — I10 Essential (primary) hypertension: Secondary | ICD-10-CM

## 2024-01-28 DIAGNOSIS — E782 Mixed hyperlipidemia: Secondary | ICD-10-CM | POA: Diagnosis not present

## 2024-01-28 NOTE — Patient Instructions (Signed)
 Medication Instructions:  Your physician recommends that you continue on your current medications as directed. Please refer to the Current Medication list given to you today.  *If you need a refill on your cardiac medications before your next appointment, please call your pharmacy*  Lab Work: None ordered today. If you have labs (blood work) drawn today and your tests are completely normal, you will receive your results only by: MyChart Message (if you have MyChart) OR A paper copy in the mail If you have any lab test that is abnormal or we need to change your treatment, we will call you to review the results.  Testing/Procedures: Your physician has requested that you have an echocardiogram. Echocardiography is a painless test that uses sound waves to create images of your heart. It provides your doctor with information about the size and shape of your heart and how well your heart's chambers and valves are working. This procedure takes approximately one hour. There are no restrictions for this procedure. Please do NOT wear cologne, perfume, aftershave, or lotions (deodorant is allowed). Please arrive 15 minutes prior to your appointment time.  Please note: We ask at that you not bring children with you during ultrasound (echo/ vascular) testing. Due to room size and safety concerns, children are not allowed in the ultrasound rooms during exams. Our front office staff cannot provide observation of children in our lobby area while testing is being conducted. An adult accompanying a patient to their appointment will only be allowed in the ultrasound room at the discretion of the ultrasound technician under special circumstances. We apologize for any inconvenience.   Follow-Up: At [facility], you and your health needs are our priority.  As part of our continuing mission to provide you with exceptional heart care, we have created designated Provider Care Teams.  These Care Teams include your  primary Cardiologist (physician) and Advanced Practice Providers (APPs -  Physician Assistants and Nurse Practitioners) who all work together to provide you with the care you need, when you need it.  We recommend signing up for the patient portal called "MyChart".  Sign up information is provided on this After Visit Summary.  MyChart is used to connect with patients for Virtual Visits (Telemedicine).  Patients are able to view lab/test results, encounter notes, upcoming appointments, etc.  Non-urgent messages can be sent to your provider as well.   To learn more about what you can do with MyChart, go to [email or web address].    Your next appointment:   1 year(s)  The format for your next appointment:   In Person  Provider:   Tessa Lerner, DO {    1st Floor: - Lobby - Registration  - Pharmacy  - Lab - Cafe  2nd Floor: - PV Lab - Diagnostic Testing (echo, CT, nuclear med)  3rd Floor: - Vacant  4th Floor: - TCTS (cardiothoracic surgery) - AFib Clinic - Structural Heart Clinic - Vascular Surgery  - Vascular Ultrasound  5th Floor: - HeartCare Cardiology (general and EP) - Clinical Pharmacy for coumadin, hypertension, lipid, weight-loss medications, and med management appointments    Valet parking services will be available as well.

## 2024-01-28 NOTE — Progress Notes (Unsigned)
 Cardiology Office Note:    Date:  01/29/2024  NAME:  Anita Cooper    MRN: [id number] DOB:  [birth year]   PCP:  Joaquim Nam, MD  Former Cardiology Providers: NA Primary Cardiologist:  Tessa Lerner, DO, [facility] (established care 01/28/2024) Electrophysiologist:  None   Referring MD: Joaquim Nam, MD  Reason of Consult: CAD evaluation  Chief Complaint  Patient presents with   Coronary artery disease involving native heart without angi   New Patient (Initial Visit)    History of Present Illness:    Anita Cooper is a 73 y.o. Caucasian female whose past medical history and cardiovascular risk factors includes: HTN, NIDDM Type II, Coronary artery calcification, HLD, schwannoma(per patient). She is being seen today for the evaluation of Coronary artery calcification at the request of Joaquim Nam, MD.  Very pleasant female presents to the office for cardiovascular evaluation given her coronary artery calcium score and cardiovascular risk factors.  She is accompanied by her husband Ebony Cargo at today's office visit.  She had a coronary calcium score done in December 2022 which was 108 AU placing her at the 75th percentile and also noted to have aortic atherosclerosis.  She at times experiences left-sided anterior chest wall discomfort that radiates to the shoulder, attributed to prior shoulder issues given her history of a minor rotator cuff tear and some bone spurs.  The pain occurs mostly when she lays on that side and resolves with repositioning.  The pain is not brought on by up related activities and does not resolve with rest.  She is very active for her age, walks approximately 2 to 3 miles couple times a week and participates in line dancing 2-4 times per week.  She does not complain of reduction in physical endurance or exertional chest pain or dyspnea.  Her LDL levels were not controlled well in the past, they were as high as 141 mg/dL she she was started on Crestor  about a year ago and her LDL levels have now improved to 40 mg/dL.  She also takes aspirin 81 mg p.o. daily.  Current Medications: No outpatient medications have been marked as taking for the 01/28/24 encounter (Office Visit) with Odis Hollingshead, Joson Sapp, DO.     Allergies:    Oseltamivir, Benadryl [diphenhydramine hcl], Losartan, and Pravastatin   Past Medical History: Past Medical History:  Diagnosis Date   Arthritis    Cancer (HCC) 2011 - Released by Dr. Darnelle Catalan 2012   stage 0 right breast S/P lumpectomy and sentinel node biopsy and radiation - per Dr. Dwain Sarna.   Carpal tunnel syndrome    Diabetes mellitus without complication (HCC)    Elevated liver function tests    Mild increase due to fatty liver seen on [place], normal after weight loss   GERD (gastroesophageal reflux disease)    Lack of relief with Prilosec and other OTC PPI's   Hyperlipidemia    Hypertension    Benign    Past Surgical History: Past Surgical History:  Procedure Laterality Date   BREAST LUMPECTOMY  2011   right lumpectomy, sentinel node biopsy   KNEE ARTHROSCOPY     Bilaterally    Social History: Social History   Tobacco Use   Smoking status: Never    Passive exposure: Never   Smokeless tobacco: Never  Vaping Use   Vaping status: Never Used  Substance Use Topics   Alcohol use: No    Alcohol/week: 0.0 standard drinks of alcohol   Drug use:  No    Family History: Family History  Problem Relation Age of Onset   Crohn's disease Mother    Cancer Father        Lung   Crohn's disease Brother    Breast cancer Daughter    Colon cancer Neg Hx     ROS:   Review of Systems  Cardiovascular:  Negative for chest pain, claudication, irregular heartbeat, leg swelling, near-syncope, orthopnea, palpitations, paroxysmal nocturnal dyspnea and syncope.  Respiratory:  Negative for shortness of breath.   Hematologic/Lymphatic: Negative for bleeding problem.    EKGs/Labs/Other Studies Reviewed:   EKG: EKG  Interpretation Date/Time:  Monday January 28 2024 13:47:32 EDT Ventricular Rate:  98 PR Interval:  116 QRS Duration:  82 QT Interval:  364 QTC Calculation: 464 R Axis:   52  Text Interpretation: Normal sinus rhythm Nonspecific ST abnormality When compared with ECG of 14-Dec-2009 15:48, Vent. rate has increased BY  32 BPM Confirmed by Tessa Lerner [phone]) on 01/28/2024 1:50:22 PM  Echocardiogram: NA  Coronary artery calcium score: 10/13/2021 Coronary calcium score of 108. This was 79 th percentile for age-, race-, and sex-matched controls. Noncardiac findings: Aortic Atherosclerosis (ICD10-I70.0)   Labs:    Latest Ref Rng & Units 01/05/2010   12:03 PM 12/14/2009    3:45 PM  CBC  WBC 3.9 - 10.3 10e3/uL 8.3  9.6   Hemoglobin 11.6 - 15.9 g/dL 62.1  30.8   Hematocrit 34.8 - 46.6 % 37.3  40.4   Platelets 145 - 400 10e3/uL 356  337        Latest Ref Rng & Units 10/01/2023    7:52 AM 09/19/2022    7:58 AM 01/02/2022    8:57 AM  BMP  Glucose 70 - 99 mg/dL 657  846  962   BUN 6 - 23 mg/dL 16  16  9    Creatinine 0.40 - 1.20 mg/dL 9.52  8.41  3.24   Sodium 135 - 145 mEq/L 141  139  139   Potassium 3.5 - 5.1 mEq/L 3.9  4.3  3.9   Chloride 96 - 112 mEq/L 96  99  100   CO2 19 - 32 mEq/L 31  32  37   Calcium 8.4 - 10.5 mg/dL 40.1  9.8  9.8       Latest Ref Rng & Units 10/01/2023    7:52 AM 09/19/2022    7:58 AM 01/02/2022    8:57 AM  CMP  Glucose 70 - 99 mg/dL 027  253  664   BUN 6 - 23 mg/dL 16  16  9    Creatinine 0.40 - 1.20 mg/dL 4.03  4.74  2.59   Sodium 135 - 145 mEq/L 141  139  139   Potassium 3.5 - 5.1 mEq/L 3.9  4.3  3.9   Chloride 96 - 112 mEq/L 96  99  100   CO2 19 - 32 mEq/L 31  32  37   Calcium 8.4 - 10.5 mg/dL 56.3  9.8  9.8   Total Protein 6.0 - 8.3 g/dL 6.8  6.8  7.2   Total Bilirubin 0.2 - 1.2 mg/dL 0.6  0.4  0.4   Alkaline Phos 39 - 117 U/L 38  32  40   AST 0 - 37 U/L 38  37  36   ALT 0 - 35 U/L 34  35  33     Lab Results  Component Value Date   CHOL 117  10/01/2023   HDL  45.90 10/01/2023   LDLCALC 40 10/01/2023   LDLDIRECT 141.2 12/03/2012   TRIG 154.0 (H) 10/01/2023   CHOLHDL 3 10/01/2023   No results for input(s): "LIPOA" in the last 8760 hours. No components found for: "NTPROBNP" No results for input(s): "PROBNP" in the last 8760 hours. No results for input(s): "TSH" in the last 8760 hours.  Physical Exam:    Today's Vitals   01/28/24 1344  BP: 128/64  Pulse: 84  Resp: 16  SpO2: 95%  Weight: 193 lb 12.8 oz (87.9 kg)  Height: 5' (1.524 m)   Body mass index is 37.85 kg/m. Wt Readings from Last 3 Encounters:  01/28/24 193 lb 12.8 oz (87.9 kg)  01/07/24 192 lb 6.4 oz (87.3 kg)  10/05/23 191 lb 6.4 oz (86.8 kg)    Physical Exam  Constitutional: No distress.  hemodynamically stable  Neck: No JVD present.  Cardiovascular: Normal rate, regular rhythm, S1 normal and S2 normal. Exam reveals no gallop, no S3 and no S4.  No murmur heard. Pulmonary/Chest: Effort normal and breath sounds normal. No stridor. She has no wheezes. She has no rales.  Musculoskeletal:        General: No edema.     Cervical back: Neck supple.  Skin: Skin is warm.     Impression & Recommendation(s):  Impression:   ICD-10-CM   1. Coronary atherosclerosis due to calcified coronary lesion  I25.10 EKG 12-Lead   I25.84 ECHOCARDIOGRAM COMPLETE    2. HYPERTENSION, BENIGN  I10     3. Mixed hyperlipidemia  E78.2        Recommendation(s):  Coronary atherosclerosis due to calcified coronary lesion calcium score of 108, 75th percentile. Asymptomatic. Discussed atypical cardiac symptoms in females and need for monitoring. - Continue low-dose aspirin daily. - Continue statin therapy. - Order echocardiogram to assess cardiac function. - Monitor for symptom changes and seek medical attention if symptoms worsen.  HYPERTENSION, BENIGN Office blood pressures are well-controlled. Continue Jardiance 25 mg p.o. daily. Continue olmesartan/hydrochlorothiazide  40/25 mg p.o. daily  Mixed hyperlipidemia Chronic and improved. LDL levels have improved from 141 mg/dL to 40 mg/dL. Continue Crestor 5 mg p.o. daily. Triglycerides are still not at goal, recommend lifestyle modifications to reduce carbohydrate intake. Cardiology following peripherally, managed by primary care provider.  Would like to follow her up in 1 year or sooner if needed.  If she remains stable at 1 year as needed follow-up be considered at the next office visit.  Patient is agreeable with the plan of care.  Orders Placed:  Orders Placed This Encounter  Procedures   EKG 12-Lead   ECHOCARDIOGRAM COMPLETE    Standing Status:   Future    Expected Date:   02/04/2024    Expiration Date:   01/27/2025    Where should this test be performed:   [facility])    Does the patient weigh less than or greater than 250 lbs?:   Patient weighs less than 250 lbs    Perflutren DEFINITY (image enhancing agent) should be administered unless hypersensitivity or allergy exist:   Administer Perflutren    Reason for exam-Echo:   Other-Full Diagnosis List    Full ICD-10/Reason for Exam:   Coronary artery calcification [[id number]]     Final Medication List:   No orders of the defined types were placed in this encounter.   There are no discontinued medications.   Current Outpatient Medications:    aspirin 81 MG tablet, Take 81 mg by mouth  daily., Disp: , Rfl:    Blood Glucose Monitoring Suppl (ONE TOUCH ULTRA 2) w/Device KIT, Use to check blood sugar daily. Dx E11.9, Disp: 1 kit, Rfl: 0   cyanocobalamin (VITAMIN B12) 1000 MCG tablet, Take 1 tablet (1,000 mcg total) by mouth daily., Disp: , Rfl:    empagliflozin (JARDIANCE) 25 MG TABS tablet, Take 1 tablet (25 mg total) by mouth daily before breakfast., Disp: 90 tablet, Rfl: 3   esomeprazole (NEXIUM) 40 MG capsule, TAKE 1 CAPSULE BY MOUTH EVERY DAY BEFORE BREAKFAST, Disp: 90 capsule, Rfl: 3   fexofenadine (ALLEGRA) 180 MG tablet,  Take 1 tablet (180 mg total) by mouth daily., Disp: 90 tablet, Rfl: 3   fluticasone (FLONASE) 50 MCG/ACT nasal spray, Place 1-2 sprays into both nostrils daily., Disp: 48 mL, Rfl: 3   glucose blood (ONETOUCH ULTRA) test strip, USE AS INSTRUCTED TO CHECK SUGAR  TWICE DAILY. DX E11.9. ONE TOUCH STRIPS., Disp: 100 strip, Rfl: 12   ibuprofen (ADVIL) 200 MG tablet, Take 400 mg by mouth every 6 (six) hours as needed., Disp: , Rfl:    Lancets (ONETOUCH ULTRASOFT) lancets, USE AS INSTRUCTED TO CHECK SUGAR  TWICE DAILY. DX E11.9., Disp: 100 each, Rfl: 12   metFORMIN (GLUCOPHAGE) 1000 MG tablet, Take 1 tablet (1,000 mg total) by mouth 2 (two) times daily with a meal., Disp: 180 tablet, Rfl: 3   Multiple Vitamin (MULTIVITAMIN) tablet, Take 1 tablet by mouth daily., Disp: , Rfl:    olmesartan-hydrochlorothiazide (BENICAR HCT) 40-25 MG tablet, TAKE 1 TABLET BY MOUTH DAILY, Disp: 90 tablet, Rfl: 3   rosuvastatin (CRESTOR) 5 MG tablet, Take 1 tablet (5 mg total) by mouth daily., Disp: 90 tablet, Rfl: 3   triamcinolone cream (KENALOG) 0.1 %, Apply 1 application topically 2 (two) times daily., Disp: 454 g, Rfl: 0  Consent:   NA  Disposition:    1 year follow-up.  Her questions and concerns were addressed to her satisfaction. She voices understanding of the recommendations provided during this encounter.    Signed, Tessa Lerner, DO, [facility]  [facility]  [address] [place], [place] [address] 01/29/2024 12:38 PM

## 2024-01-29 ENCOUNTER — Encounter: Payer: Self-pay | Admitting: Cardiology

## 2024-02-03 ENCOUNTER — Other Ambulatory Visit: Payer: Self-pay | Admitting: Family Medicine

## 2024-02-05 ENCOUNTER — Ambulatory Visit (INDEPENDENT_AMBULATORY_CARE_PROVIDER_SITE_OTHER): Admitting: Family Medicine

## 2024-02-05 ENCOUNTER — Encounter: Payer: Self-pay | Admitting: Family Medicine

## 2024-02-05 VITALS — BP 122/72 | HR 82 | Temp 98.8°F | Ht 60.0 in | Wt 192.4 lb

## 2024-02-05 DIAGNOSIS — R300 Dysuria: Secondary | ICD-10-CM | POA: Diagnosis not present

## 2024-02-05 LAB — URINALYSIS, ROUTINE W REFLEX MICROSCOPIC
Bilirubin Urine: NEGATIVE
Ketones, ur: 15 — AB
Leukocytes,Ua: NEGATIVE
Nitrite: POSITIVE — AB
Specific Gravity, Urine: 1.01 (ref 1.000–1.030)
Total Protein, Urine: 100 — AB
Urine Glucose: >=1000 — AB
Urobilinogen, UA: 2 — AB (ref 0.0–1.0)
pH: 5.5 (ref 5.0–8.0)

## 2024-02-05 MED ORDER — ONETOUCH ULTRASOFT LANCETS MISC: 12 refills | Status: DC

## 2024-02-05 MED ORDER — SULFAMETHOXAZOLE-TRIMETHOPRIM 800-160 MG PO TABS: 1.0000 | ORAL_TABLET | Freq: Two times a day (BID) | ORAL | 0 refills | Status: DC

## 2024-02-05 NOTE — Patient Instructions (Addendum)
 I would stop jardiance for at least a week.   I would take 1/2 tab of metformin twice a day while on the antibiotics. Drink plenty of water and start the antibiotics today.   AZO if needed.  Drop off urine tomorrow if needed.  Goal is to get a urine sample prior to starting the antibiotics.   We'll contact you with your lab report.  Take care.

## 2024-02-05 NOTE — Progress Notes (Unsigned)
 dysuria: yes- AZO helped with burning.  duration of symptoms: started 2 days ago.   abdominal pain: lower abd ache.   fevers:no back pain: lower back pain, on the left > right.   vomiting:no U/a pending.    D/w pt about stopping jardiance for at least a week.    She is leaving to go to [place] for a funeral tomorrow.   Meds, vitals, and allergies reviewed.  Per HPI unless specifically indicated in ROS section   GEN: nad, alert and oriented HEENT: mucous membranes moist NECK: supple CV: rrr.  PULM: ctab, no inc wob ABD: soft, +bs, suprapubic area tender EXT: no edema SKIN: no acute rash BACK: no CVA pain but lower back ttp.

## 2024-02-06 DIAGNOSIS — R300 Dysuria: Secondary | ICD-10-CM | POA: Insufficient documentation

## 2024-02-06 NOTE — Assessment & Plan Note (Signed)
 Presumed UTI.  At this point still okay for outpatient follow-up. I would stop jardiance for at least a week.   I would take 1/2 tab of metformin twice a day while on the antibiotics. Drink plenty of water and start the antibiotics today.   AZO if needed.  Discussed collecting urine sample prior to starting antibiotic.  See after visit summary.

## 2024-02-07 LAB — URINE CULTURE
MICRO NUMBER:: 16244785
SPECIMEN QUALITY:: ADEQUATE

## 2024-02-08 ENCOUNTER — Encounter: Payer: Self-pay | Admitting: Family Medicine

## 2024-02-19 DIAGNOSIS — H0288A Meibomian gland dysfunction right eye, upper and lower eyelids: Secondary | ICD-10-CM | POA: Diagnosis not present

## 2024-02-25 ENCOUNTER — Ambulatory Visit (HOSPITAL_COMMUNITY): Attending: Cardiology

## 2024-02-25 DIAGNOSIS — I2510 Atherosclerotic heart disease of native coronary artery without angina pectoris: Secondary | ICD-10-CM | POA: Insufficient documentation

## 2024-02-25 DIAGNOSIS — I2584 Coronary atherosclerosis due to calcified coronary lesion: Secondary | ICD-10-CM | POA: Diagnosis not present

## 2024-02-25 LAB — ECHOCARDIOGRAM COMPLETE
Area-P 1/2: 3.46 cm2
S' Lateral: 2.4 cm

## 2024-02-29 DIAGNOSIS — Z1371 Encounter for nonprocreative screening for genetic disease carrier status: Secondary | ICD-10-CM | POA: Diagnosis not present

## 2024-02-29 DIAGNOSIS — Z1330 Encounter for screening examination for mental health and behavioral disorders, unspecified: Secondary | ICD-10-CM | POA: Diagnosis not present

## 2024-02-29 DIAGNOSIS — Z853 Personal history of malignant neoplasm of breast: Secondary | ICD-10-CM | POA: Diagnosis not present

## 2024-02-29 DIAGNOSIS — E119 Type 2 diabetes mellitus without complications: Secondary | ICD-10-CM | POA: Diagnosis not present

## 2024-02-29 DIAGNOSIS — Z01419 Encounter for gynecological examination (general) (routine) without abnormal findings: Secondary | ICD-10-CM | POA: Diagnosis not present

## 2024-03-01 ENCOUNTER — Encounter: Payer: Self-pay | Admitting: Cardiology

## 2024-03-03 DIAGNOSIS — Z85828 Personal history of other malignant neoplasm of skin: Secondary | ICD-10-CM | POA: Diagnosis not present

## 2024-03-03 DIAGNOSIS — L821 Other seborrheic keratosis: Secondary | ICD-10-CM | POA: Diagnosis not present

## 2024-03-03 DIAGNOSIS — L814 Other melanin hyperpigmentation: Secondary | ICD-10-CM | POA: Diagnosis not present

## 2024-03-03 DIAGNOSIS — D225 Melanocytic nevi of trunk: Secondary | ICD-10-CM | POA: Diagnosis not present

## 2024-03-12 DIAGNOSIS — M79644 Pain in right finger(s): Secondary | ICD-10-CM | POA: Diagnosis not present

## 2024-03-19 DIAGNOSIS — T1511XA Foreign body in conjunctival sac, right eye, initial encounter: Secondary | ICD-10-CM | POA: Diagnosis not present

## 2024-03-22 DIAGNOSIS — H68101 Unspecified obstruction of Eustachian tube, right ear: Secondary | ICD-10-CM | POA: Diagnosis not present

## 2024-03-24 ENCOUNTER — Encounter (HOSPITAL_COMMUNITY): Payer: Self-pay

## 2024-03-31 ENCOUNTER — Ambulatory Visit: Payer: Medicare Other | Admitting: Family Medicine

## 2024-03-31 DIAGNOSIS — K080 Exfoliation of teeth due to systemic causes: Secondary | ICD-10-CM | POA: Diagnosis not present

## 2024-04-14 ENCOUNTER — Ambulatory Visit (INDEPENDENT_AMBULATORY_CARE_PROVIDER_SITE_OTHER): Admitting: Family Medicine

## 2024-04-14 ENCOUNTER — Encounter: Payer: Self-pay | Admitting: Family Medicine

## 2024-04-14 VITALS — BP 128/74 | HR 81 | Temp 98.4°F | Ht 60.0 in | Wt 190.2 lb

## 2024-04-14 DIAGNOSIS — E119 Type 2 diabetes mellitus without complications: Secondary | ICD-10-CM

## 2024-04-14 LAB — MICROALBUMIN / CREATININE URINE RATIO
Creatinine,U: 125.6 mg/dL
Microalb Creat Ratio: 12.8 mg/g (ref 0.0–30.0)
Microalb, Ur: 1.6 mg/dL (ref 0.0–1.9)

## 2024-04-14 LAB — POCT GLYCOSYLATED HEMOGLOBIN (HGB A1C): Hemoglobin A1C: 7.3 % — AB (ref 4.0–5.6)

## 2024-04-14 NOTE — Patient Instructions (Addendum)
 Go to the lab on the way out.   If you have mychart we'll likely use that to update you.    Take care.  Glad to see you. Recheck in about 6 month with labs at or ahead of the visit.  Update me as needed.  Thanks for your effort.

## 2024-04-14 NOTE — Progress Notes (Unsigned)
 Diabetes:  Using medications without difficulties: yes Hypoglycemic episodes:no Hyperglycemic episodes: see below.   Feet problems:no Blood Sugars averaging: usually ~120-130s when off prednisone .   eye exam within last year: f/u pending.  A1c 7.3. This is stable in spite of recent events. She had injections in R 2nd finger per outside clinic.  She had UC eval for sinus pressure/ETD- took prednisone  for that.  Sugar was clearly higher of prednisone .   Microalbumin pending.  She is back on jardiance  and tolerating that.  D/w pt.  Still on metformin .    H/o CAD.  She is exercising w/o CP.  Prev echo d/w pt.  She'll have yearly cardiology follow up.    She is line dancing for exercise.    Meds, vitals, and allergies reviewed.   ROS: Per HPI unless specifically indicated in ROS section   GEN: nad, alert and oriented HEENT: ncat NECK: supple w/o LA CV: rrr. PULM: ctab, no inc wob ABD: soft, +bs EXT: no edema SKIN: no acute rash  Diabetic foot exam: Normal inspection No skin breakdown No calluses  Normal DP pulses Normal sensation to light touch and monofilament Nails normal

## 2024-04-16 ENCOUNTER — Ambulatory Visit: Payer: Self-pay | Admitting: Family Medicine

## 2024-04-16 NOTE — Assessment & Plan Note (Signed)
 A1c 7.3. This is stable in spite of recent events. She had injections in R 2nd finger per outside clinic.  She had UC eval for sinus pressure/ETD- took prednisone  for that.  Sugar was clearly higher of prednisone .   Microalbumin pending.  See notes on labs.  Continue work on diet and exercise.  No change in medications.  Plan on recheck in about 6 months.  See after visit summary.

## 2024-04-28 DIAGNOSIS — K080 Exfoliation of teeth due to systemic causes: Secondary | ICD-10-CM | POA: Diagnosis not present

## 2024-07-11 DIAGNOSIS — E119 Type 2 diabetes mellitus without complications: Secondary | ICD-10-CM | POA: Diagnosis not present

## 2024-07-17 DIAGNOSIS — H25813 Combined forms of age-related cataract, bilateral: Secondary | ICD-10-CM | POA: Diagnosis not present

## 2024-07-17 DIAGNOSIS — H52223 Regular astigmatism, bilateral: Secondary | ICD-10-CM | POA: Diagnosis not present

## 2024-07-17 DIAGNOSIS — H35341 Macular cyst, hole, or pseudohole, right eye: Secondary | ICD-10-CM | POA: Diagnosis not present

## 2024-07-17 DIAGNOSIS — H43813 Vitreous degeneration, bilateral: Secondary | ICD-10-CM | POA: Diagnosis not present

## 2024-07-17 DIAGNOSIS — E119 Type 2 diabetes mellitus without complications: Secondary | ICD-10-CM | POA: Diagnosis not present

## 2024-07-28 DIAGNOSIS — H25811 Combined forms of age-related cataract, right eye: Secondary | ICD-10-CM | POA: Diagnosis not present

## 2024-08-07 DIAGNOSIS — H25812 Combined forms of age-related cataract, left eye: Secondary | ICD-10-CM | POA: Diagnosis not present

## 2024-08-26 ENCOUNTER — Encounter: Payer: Self-pay | Admitting: Family Medicine

## 2024-08-28 ENCOUNTER — Other Ambulatory Visit: Payer: Self-pay | Admitting: Neurosurgery

## 2024-08-28 DIAGNOSIS — D3614 Benign neoplasm of peripheral nerves and autonomic nervous system of thorax: Secondary | ICD-10-CM

## 2024-09-07 DIAGNOSIS — S92334A Nondisplaced fracture of third metatarsal bone, right foot, initial encounter for closed fracture: Secondary | ICD-10-CM | POA: Diagnosis not present

## 2024-09-07 DIAGNOSIS — M19071 Primary osteoarthritis, right ankle and foot: Secondary | ICD-10-CM | POA: Diagnosis not present

## 2024-09-07 DIAGNOSIS — X58XXXA Exposure to other specified factors, initial encounter: Secondary | ICD-10-CM | POA: Diagnosis not present

## 2024-09-07 DIAGNOSIS — Y9389 Activity, other specified: Secondary | ICD-10-CM | POA: Diagnosis not present

## 2024-09-07 DIAGNOSIS — Y9289 Other specified places as the place of occurrence of the external cause: Secondary | ICD-10-CM | POA: Diagnosis not present

## 2024-09-07 DIAGNOSIS — M79671 Pain in right foot: Secondary | ICD-10-CM | POA: Diagnosis not present

## 2024-09-07 DIAGNOSIS — Y998 Other external cause status: Secondary | ICD-10-CM | POA: Diagnosis not present

## 2024-09-08 DIAGNOSIS — M84374A Stress fracture, right foot, initial encounter for fracture: Secondary | ICD-10-CM | POA: Insufficient documentation

## 2024-09-08 DIAGNOSIS — S83289A Other tear of lateral meniscus, current injury, unspecified knee, initial encounter: Secondary | ICD-10-CM | POA: Insufficient documentation

## 2024-09-10 DIAGNOSIS — S92334D Nondisplaced fracture of third metatarsal bone, right foot, subsequent encounter for fracture with routine healing: Secondary | ICD-10-CM | POA: Diagnosis not present

## 2024-09-15 DIAGNOSIS — Z1231 Encounter for screening mammogram for malignant neoplasm of breast: Secondary | ICD-10-CM | POA: Diagnosis not present

## 2024-09-15 DIAGNOSIS — K080 Exfoliation of teeth due to systemic causes: Secondary | ICD-10-CM | POA: Diagnosis not present

## 2024-09-15 LAB — HM MAMMOGRAPHY

## 2024-09-16 ENCOUNTER — Ambulatory Visit
Admission: RE | Admit: 2024-09-16 | Discharge: 2024-09-16 | Disposition: A | Discharge status: Home / Self Care | Source: Ambulatory Visit | Attending: Neurosurgery | Admitting: Neurosurgery

## 2024-09-16 ENCOUNTER — Encounter: Payer: Self-pay | Admitting: Family Medicine

## 2024-09-16 ENCOUNTER — Ambulatory Visit: Payer: Self-pay | Admitting: Family Medicine

## 2024-09-16 DIAGNOSIS — D3614 Benign neoplasm of peripheral nerves and autonomic nervous system of thorax: Secondary | ICD-10-CM | POA: Insufficient documentation

## 2024-09-16 DIAGNOSIS — M5184 Other intervertebral disc disorders, thoracic region: Secondary | ICD-10-CM | POA: Diagnosis not present

## 2024-09-16 MED ORDER — GADOBUTROL 1 MMOL/ML IV SOLN
8.0000 mL | Freq: Once | INTRAVENOUS | Status: AC | PRN
  Administered 2024-09-16: 8 mL via INTRAVENOUS

## 2024-09-24 ENCOUNTER — Other Ambulatory Visit: Payer: Self-pay | Admitting: Family Medicine

## 2024-09-24 DIAGNOSIS — E119 Type 2 diabetes mellitus without complications: Secondary | ICD-10-CM

## 2024-09-24 DIAGNOSIS — M84374A Stress fracture, right foot, initial encounter for fracture: Secondary | ICD-10-CM | POA: Diagnosis not present

## 2024-09-29 DIAGNOSIS — D3614 Benign neoplasm of peripheral nerves and autonomic nervous system of thorax: Secondary | ICD-10-CM | POA: Diagnosis not present

## 2024-09-29 DIAGNOSIS — Z6836 Body mass index (BMI) 36.0-36.9, adult: Secondary | ICD-10-CM | POA: Diagnosis not present

## 2024-10-01 ENCOUNTER — Other Ambulatory Visit (INDEPENDENT_AMBULATORY_CARE_PROVIDER_SITE_OTHER)

## 2024-10-01 DIAGNOSIS — E119 Type 2 diabetes mellitus without complications: Secondary | ICD-10-CM

## 2024-10-01 LAB — COMPREHENSIVE METABOLIC PANEL WITH GFR
ALT: 26 U/L (ref 0–35)
AST: 35 U/L (ref 0–37)
Albumin: 4.1 g/dL (ref 3.5–5.2)
Alkaline Phosphatase: 37 U/L — ABNORMAL LOW (ref 39–117)
BUN: 19 mg/dL (ref 6–23)
CO2: 31 meq/L (ref 19–32)
Calcium: 9.7 mg/dL (ref 8.4–10.5)
Chloride: 97 meq/L (ref 96–112)
Creatinine, Ser: 0.9 mg/dL (ref 0.40–1.20)
GFR: 63.92 mL/min (ref >60.00)
Glucose, Bld: 148 mg/dL — ABNORMAL HIGH (ref 70–99)
Potassium: 4.1 meq/L (ref 3.5–5.1)
Sodium: 139 meq/L (ref 135–145)
Total Bilirubin: 0.7 mg/dL (ref 0.2–1.2)
Total Protein: 6.8 g/dL (ref 6.0–8.3)

## 2024-10-01 LAB — HEMOGLOBIN A1C: Hgb A1c MFr Bld: 7.2 % — ABNORMAL HIGH (ref 4.6–6.5)

## 2024-10-01 LAB — VITAMIN B12: Vitamin B-12: 1034 pg/mL — ABNORMAL HIGH (ref 211–911)

## 2024-10-01 LAB — LIPID PANEL
Cholesterol: 106 mg/dL (ref 0–200)
HDL: 43.8 mg/dL (ref >39.00)
LDL Cholesterol: 34 mg/dL (ref 0–99)
NonHDL: 62.44
Total CHOL/HDL Ratio: 2
Triglycerides: 141 mg/dL (ref 0.0–149.0)
VLDL: 28.2 mg/dL (ref 0.0–40.0)

## 2024-10-02 ENCOUNTER — Ambulatory Visit: Payer: Self-pay | Admitting: Family Medicine

## 2024-10-06 ENCOUNTER — Other Ambulatory Visit

## 2024-10-13 ENCOUNTER — Ambulatory Visit (INDEPENDENT_AMBULATORY_CARE_PROVIDER_SITE_OTHER)

## 2024-10-13 ENCOUNTER — Ambulatory Visit: Admitting: Podiatry

## 2024-10-13 DIAGNOSIS — M84374A Stress fracture, right foot, initial encounter for fracture: Secondary | ICD-10-CM

## 2024-10-13 DIAGNOSIS — D3614 Benign neoplasm of peripheral nerves and autonomic nervous system of thorax: Secondary | ICD-10-CM | POA: Insufficient documentation

## 2024-10-13 NOTE — Progress Notes (Signed)
   Chief Complaint  Patient presents with   Foot Pain    Dorsal forefoot right - foot pain started about 6 [place] ago[place] [facility]-said stress fracture, been in cast x 2, went to [facility] here and they said didn't see a stress fracture, switched over to boot, has felt better, but wanted another opinion, does line dance regularly, still swollen   New Patient (Initial Visit)    Est pt 2021    HPI: 73 y.o. female presenting today for evaluation of a possible stress fracture to the right forefoot  Past Medical History:  Diagnosis Date   Arthritis    Cancer (HCC) 2011 - Released by Dr. Layla 2012   stage 0 right breast S/P lumpectomy and sentinel node biopsy and radiation - per Dr. Ebbie.   Carpal tunnel syndrome    Diabetes mellitus without complication (HCC)    Elevated liver function tests    Mild increase due to fatty liver seen on US , normal after weight loss   GERD (gastroesophageal reflux disease)    Lack of relief with Prilosec and other OTC PPI's   Hyperlipidemia    Hypertension    Benign    Past Surgical History:  Procedure Laterality Date   BREAST LUMPECTOMY  2011   right lumpectomy, sentinel node biopsy   KNEE ARTHROSCOPY     Bilaterally    Allergies  Allergen Reactions   Oseltamivir  Nausea Only    Other reaction(s): Insomnia   Benadryl [Diphenhydramine Hcl]     Insomnia and headache   Losartan      Presumed cause of itching, tolerated benicar    Pravastatin      Muscle aches.       Physical Exam: General: The patient is alert and oriented x3 in no acute distress.  Dermatology: Skin is warm, dry and supple bilateral lower extremities.   Vascular: Palpable pedal pulses bilaterally. Capillary refill within normal limits.  No appreciable edema.  No erythema.  Neurological: Grossly intact via light touch  Musculoskeletal Exam: No pedal deformities noted.  There is some tenderness to palpation around the distal aspect of  the 3rd and 5th metatarsal of the right foot  Radiographic Exam RT foot 10/13/2024:  Normal osseous mineralization. Joint spaces preserved.  No fractures or osseous irregularities noted.  Impression: Negative  Assessment/Plan of Care: 1.  Likely stress fracture of third metatarsal and possibly fifth metatarsal right foot  -Patient evaluated.  X-rays reviewed -Patient already feels significantly better.  She has been in the cam boot for about 6 weeks and has actually started to transition out of the cam boot into tennis shoes around the home. -Okay to transition out of the cam boot to good supportive tennis shoes and sneakers like she is doing.  Advised against going barefoot -Slowly increase activity.  I do believe the foot should continue to heal uneventfully -Return to clinic PRN       Thresa EMERSON Sar, DPM [facility]  Dr. Thresa EMERSON Sar, DPM    [address] [place], [place] [address]                Office [phone]  Fax [phone]

## 2024-10-14 ENCOUNTER — Ambulatory Visit: Admitting: Family Medicine

## 2024-10-14 ENCOUNTER — Encounter: Payer: Self-pay | Admitting: Family Medicine

## 2024-10-14 VITALS — BP 130/62 | HR 71 | Temp 98.1°F | Ht 60.0 in | Wt 187.0 lb

## 2024-10-14 DIAGNOSIS — R222 Localized swelling, mass and lump, trunk: Secondary | ICD-10-CM

## 2024-10-14 DIAGNOSIS — K219 Gastro-esophageal reflux disease without esophagitis: Secondary | ICD-10-CM

## 2024-10-14 DIAGNOSIS — Z0000 Encounter for general adult medical examination without abnormal findings: Secondary | ICD-10-CM

## 2024-10-14 DIAGNOSIS — Z7189 Other specified counseling: Secondary | ICD-10-CM

## 2024-10-14 DIAGNOSIS — I10 Essential (primary) hypertension: Secondary | ICD-10-CM | POA: Diagnosis not present

## 2024-10-14 DIAGNOSIS — E7800 Pure hypercholesterolemia, unspecified: Secondary | ICD-10-CM | POA: Diagnosis not present

## 2024-10-14 DIAGNOSIS — Z87312 Personal history of (healed) stress fracture: Secondary | ICD-10-CM

## 2024-10-14 DIAGNOSIS — E119 Type 2 diabetes mellitus without complications: Secondary | ICD-10-CM

## 2024-10-14 MED ORDER — ONETOUCH ULTRA VI STRP: ORAL_STRIP | 12 refills | Status: AC

## 2024-10-14 MED ORDER — ONETOUCH ULTRASOFT LANCETS MISC: 12 refills | Status: AC

## 2024-10-14 MED ORDER — EMPAGLIFLOZIN 25 MG PO TABS: 25.0000 mg | ORAL_TABLET | Freq: Every day | ORAL | 3 refills | Status: DC

## 2024-10-14 MED ORDER — ROSUVASTATIN CALCIUM 5 MG PO TABS: 5.0000 mg | ORAL_TABLET | Freq: Every day | ORAL | 3 refills | Status: AC

## 2024-10-14 MED ORDER — EMPAGLIFLOZIN 25 MG PO TABS: 25.0000 mg | ORAL_TABLET | Freq: Every day | ORAL | 3 refills | Status: AC

## 2024-10-14 MED ORDER — METFORMIN HCL 1000 MG PO TABS: 1000.0000 mg | ORAL_TABLET | Freq: Two times a day (BID) | ORAL | 3 refills | Status: AC

## 2024-10-14 MED ORDER — OLMESARTAN MEDOXOMIL-HCTZ 40-25 MG PO TABS: ORAL_TABLET | ORAL | 3 refills | Status: AC

## 2024-10-14 MED ORDER — ESOMEPRAZOLE MAGNESIUM 40 MG PO CPDR: DELAYED_RELEASE_CAPSULE | ORAL | 3 refills | Status: AC

## 2024-10-14 MED ORDER — METFORMIN HCL 1000 MG PO TABS: 1000.0000 mg | ORAL_TABLET | Freq: Two times a day (BID) | ORAL | 3 refills | Status: DC

## 2024-10-14 MED ORDER — FLUTICASONE PROPIONATE 50 MCG/ACT NA SUSP: 1.0000 | Freq: Every day | NASAL | 3 refills | Status: AC

## 2024-10-14 NOTE — Progress Notes (Unsigned)
 Flu 2025 Shingles previously done PNA previously done Tetanus 2014, d/w pt.   COVID-vaccine previously done Colonoscopy 2023 Breast cancer screening 2025 Bone density WNL 2023 Advance directive-husband designated if patient were incapacitated.   Diabetes:  Using medications without difficulties: yes Hypoglycemic episodes:no Hyperglycemic episodes:no Feet problems: see below.   Blood Sugars averaging: usually 120-150.   eye exam within last year: yes Labs d/w pt.     Hypertension:               Using medication without problems or lightheadedness: yes No CP.    Edema:no Short of breath:no Labs d/w pt.     GERD still improved on PPI, d/w pt.     Elevated Cholesterol: Using medications without problems: yes Muscle aches: no Diet compliance: d/w pt.   Exercise: d/w pt.   Labs d/w pt.    She had neurosurgery eval. 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    She has ortho f/u pending re R 2nd finger with dec ROM.    She is off B12 since her level was high.  D/w pt about recheck later.    Her foot has clearly improved with CAM boot.  Had sports med eval. she hopes to graduate from the cam boot some.   PMH and SH reviewed.    Vital signs, Meds and allergies reviewed.   ROS: Per HPI unless specifically indicated in ROS section    GEN: nad, alert and oriented HEENT: mucous membranes moist NECK: supple w/o LA CV: rrr PULM: ctab, no inc wob ABD: soft, +bs EXT: no edema SKIN: no acute rash R foot in CAM walker.

## 2024-10-14 NOTE — Patient Instructions (Addendum)
 Recheck in about 6 months.  Labs ahead of time.  Please ask the front about getting a record release from Dr. Gillie.  Take care.  Glad to see you.

## 2024-10-15 NOTE — Assessment & Plan Note (Signed)
 Improved with Nexium .  Continue as is.

## 2024-10-15 NOTE — Assessment & Plan Note (Signed)
 Flu 2025 Shingles previously done PNA previously done Tetanus 2014, d/w pt.   COVID-vaccine previously done Colonoscopy 2023 Breast cancer screening 2025 Bone density WNL 2023 Advance directive-husband designated if patient were incapacitated.

## 2024-10-15 NOTE — Assessment & Plan Note (Signed)
  She had neurosurgery eval. 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  Followed by outside clinic.

## 2024-10-15 NOTE — Assessment & Plan Note (Signed)
 Continue work on diet and exercise.  Continue Crestor .

## 2024-10-15 NOTE — Assessment & Plan Note (Signed)
 Continue work on diet and exercise.  Continue olmesartan  hydrochlorothiazide 

## 2024-10-15 NOTE — Assessment & Plan Note (Signed)
 A1c stable. Recheck in about 6 months.  Labs ahead of time.  Continue work on diet and exercise.  Continue Jardiance  and metformin .

## 2024-10-15 NOTE — Assessment & Plan Note (Signed)
Advance directive- husband designated if patient were incapacitated.  

## 2024-10-23 NOTE — Progress Notes (Signed)
 Anita Cooper                                          MRN: [id number]   10/23/2024   The [facility] Quality Team Specialist reviewed this patient medical record for the purposes of chart review for care gap closure. The following were reviewed: abstraction for care gap closure-kidney health evaluation for diabetes:eGFR  and uACR.    [facility] Quality Team

## 2024-10-27 DIAGNOSIS — K080 Exfoliation of teeth due to systemic causes: Secondary | ICD-10-CM | POA: Diagnosis not present

## 2024-12-17 ENCOUNTER — Telehealth: Payer: Self-pay | Admitting: Family Medicine

## 2024-12-17 NOTE — Telephone Encounter (Signed)
 Copied from CRM [id number]. Topic: Clinical - Request for Lab/Test Order >> Dec 16, 2024  2:32 PM Chasity T wrote: Reason for CRM: Chyrl from [facility] pharmacist regarding a bone density to be ordered for patient since her recent injury due to her age . CMS states it does need to be completed by 03/06/2025. He will be sending a fax over as well. He ask that you give him a call if any questions are needed.   Phone number: [phone] ext [phone]

## 2024-12-19 NOTE — Telephone Encounter (Signed)
 I can talk to patient about this in the future.  I didn't call Chyrl back.

## 2025-03-30 ENCOUNTER — Other Ambulatory Visit

## 2025-04-14 ENCOUNTER — Ambulatory Visit: Admitting: Family Medicine

## 2025-10-19 ENCOUNTER — Encounter: Admitting: Family Medicine

## 2025-10-19 ENCOUNTER — Ambulatory Visit
